# Patient Record
Sex: Female | Born: 1950 | Race: Black or African American | Hispanic: No | Smoking: Former smoker
Health system: Southern US, Community
[De-identification: ages and names within clinical notes are randomized; demographics above are authoritative.]

## PROBLEM LIST (undated history)

## (undated) DIAGNOSIS — E119 Type 2 diabetes mellitus without complications: Secondary | ICD-10-CM

## (undated) DIAGNOSIS — M889 Osteitis deformans of unspecified bone: Secondary | ICD-10-CM

## (undated) DIAGNOSIS — I10 Essential (primary) hypertension: Secondary | ICD-10-CM

## (undated) DIAGNOSIS — M719 Bursopathy, unspecified: Secondary | ICD-10-CM

## (undated) HISTORY — PX: CHOLECYSTECTOMY: SHX55

## (undated) HISTORY — PX: ABDOMINAL HYSTERECTOMY: SHX81

---

## 1998-06-06 ENCOUNTER — Other Ambulatory Visit: Admission: RE | Admit: 1998-06-06 | Discharge: 1998-06-06 | Payer: Self-pay | Admitting: Family Medicine

## 1998-07-26 ENCOUNTER — Ambulatory Visit (HOSPITAL_COMMUNITY): Admission: RE | Admit: 1998-07-26 | Discharge: 1998-07-26 | Payer: Self-pay | Admitting: Family Medicine

## 1998-07-27 ENCOUNTER — Ambulatory Visit (HOSPITAL_COMMUNITY): Admission: RE | Admit: 1998-07-27 | Discharge: 1998-07-27 | Payer: Self-pay | Admitting: Unknown Physician Specialty

## 1998-07-27 ENCOUNTER — Encounter: Payer: Self-pay | Admitting: Family Medicine

## 1998-08-18 ENCOUNTER — Encounter: Payer: Self-pay | Admitting: Family Medicine

## 1998-08-18 ENCOUNTER — Ambulatory Visit (HOSPITAL_COMMUNITY): Admission: RE | Admit: 1998-08-18 | Discharge: 1998-08-18 | Payer: Self-pay | Admitting: Family Medicine

## 1999-01-19 ENCOUNTER — Ambulatory Visit (HOSPITAL_COMMUNITY): Admission: RE | Admit: 1999-01-19 | Discharge: 1999-01-19 | Payer: Self-pay | Admitting: Family Medicine

## 2001-08-01 ENCOUNTER — Encounter: Payer: Self-pay | Admitting: Emergency Medicine

## 2001-08-01 ENCOUNTER — Emergency Department (HOSPITAL_COMMUNITY): Admission: EM | Admit: 2001-08-01 | Discharge: 2001-08-01 | Payer: Self-pay | Admitting: Emergency Medicine

## 2002-04-20 ENCOUNTER — Emergency Department (HOSPITAL_COMMUNITY): Admission: EM | Admit: 2002-04-20 | Discharge: 2002-04-20 | Payer: Self-pay

## 2002-05-15 ENCOUNTER — Emergency Department (HOSPITAL_COMMUNITY): Admission: EM | Admit: 2002-05-15 | Discharge: 2002-05-16 | Payer: Self-pay | Admitting: *Deleted

## 2002-05-15 ENCOUNTER — Encounter: Payer: Self-pay | Admitting: Family Medicine

## 2002-09-13 ENCOUNTER — Emergency Department (HOSPITAL_COMMUNITY): Admission: EM | Admit: 2002-09-13 | Discharge: 2002-09-13 | Payer: Self-pay

## 2003-01-25 ENCOUNTER — Encounter (INDEPENDENT_AMBULATORY_CARE_PROVIDER_SITE_OTHER): Payer: Self-pay

## 2003-01-25 ENCOUNTER — Observation Stay (HOSPITAL_COMMUNITY): Admission: RE | Admit: 2003-01-25 | Discharge: 2003-01-26 | Payer: Self-pay | Admitting: General Surgery

## 2003-02-22 ENCOUNTER — Encounter: Admission: RE | Admit: 2003-02-22 | Discharge: 2003-05-23 | Payer: Self-pay | Admitting: Internal Medicine

## 2004-09-25 ENCOUNTER — Ambulatory Visit (HOSPITAL_COMMUNITY): Admission: RE | Admit: 2004-09-25 | Discharge: 2004-09-25 | Payer: Self-pay | Admitting: Gastroenterology

## 2004-09-25 ENCOUNTER — Encounter (INDEPENDENT_AMBULATORY_CARE_PROVIDER_SITE_OTHER): Payer: Self-pay | Admitting: *Deleted

## 2005-01-23 ENCOUNTER — Emergency Department (HOSPITAL_COMMUNITY): Admission: EM | Admit: 2005-01-23 | Discharge: 2005-01-23 | Payer: Self-pay | Admitting: Emergency Medicine

## 2005-03-04 ENCOUNTER — Emergency Department (HOSPITAL_COMMUNITY): Admission: EM | Admit: 2005-03-04 | Discharge: 2005-03-04 | Payer: Self-pay | Admitting: Emergency Medicine

## 2007-12-23 ENCOUNTER — Emergency Department (HOSPITAL_COMMUNITY): Admission: EM | Admit: 2007-12-23 | Discharge: 2007-12-23 | Payer: Self-pay | Admitting: Emergency Medicine

## 2008-06-10 ENCOUNTER — Emergency Department (HOSPITAL_COMMUNITY): Admission: EM | Admit: 2008-06-10 | Discharge: 2008-06-10 | Payer: Self-pay | Admitting: Emergency Medicine

## 2010-05-03 ENCOUNTER — Encounter (HOSPITAL_COMMUNITY): Admission: RE | Admit: 2010-05-03 | Discharge: 2010-06-20 | Payer: Self-pay | Admitting: Orthopedic Surgery

## 2010-07-06 ENCOUNTER — Ambulatory Visit (HOSPITAL_COMMUNITY): Admission: RE | Admit: 2010-07-06 | Discharge: 2010-07-06 | Payer: Self-pay | Admitting: Obstetrics and Gynecology

## 2011-04-06 NOTE — Op Note (Signed)
NAME:  Leah Thomas, Leah Thomas                      ACCOUNT NO.:  [id number]   MEDICAL RECORD NO.:  [id number]                   PATIENT TYPE:  AMB   LOCATION:  DAY                                  FACILITY:  [facility]   PHYSICIAN:  Anselm Pancoast. Zachery Dakins, M.D.          DATE OF BIRTH:  1950/12/10   DATE OF PROCEDURE:  01/25/2003  DATE OF DISCHARGE:                                 OPERATIVE REPORT   PREOPERATIVE DIAGNOSES:  Chronic cholelithiasis with probable passage of  common duct stone.   POSTOPERATIVE DIAGNOSES:  Chronic cholelithiasis with probable passage of  common duct stone.   OPERATION:  Laparoscopic cholecystectomy with cholangiogram.   ANESTHESIA:  General.   HISTORY:  Leah Thomas is a 60 year old female who I had seen previously  for anorectal problems and she came in the office on Friday after being seen  in the [facility] for epigastric pain and they did an ultrasound plus liver  tests which showed multiple gallstones. She has been seen previously in the  emergency room here and was thought to be having chest wall pain,  diaphragmatic spasm or something. In retrospect, it was probably biliary in  origin also. The patient does get epigastric pain radiating to the smaller  back in which she was seen at [facility]. Her liver tests were mildly  elevated as well as a lipase that was elevated. Her pain had subsided about  24 hours prior to the onset of pain and her previous surgery she has had a  hysterectomy in 1992 and otherwise is in good health. I recommended that we  proceed with a laparoscopic cholecystectomy with cholangiogram and she is  here for the planned procedure. Preoperatively she has 3 gm of Unasyn that  was administered, she has PAS stockings and induction of general anesthesia.  The abdomen was prepped with Betadine surgical solution, draped in a sterile  manner. A small incision was made below the umbilicus, she is a little bit  over weight, with fascia  carefully opened and a hemostat placed carefully  through the peritoneum. Traction sutures were placed, Hasson cannula  introduced and then carbon dioxide used. The patient's gallbladder was not  acutely inflamed but there was adhesions around it and these were carefully  taken then and then the proximal portion of the gallbladder was identified  where the junction of the cystic artery and cystic duct was appearing. I  placed a clip flush on the gallbladder cystic duct junction. The anterior  branch of the cystic artery was then dissected free and this was doubly  clipped proximally, singly distally and divided and then we made a small  opening through the proximal cystic duct. One stone was at the area where we  opened and this was milked back. We tried to see if we could lift any other  stones and I could not put a Cooke catheter in it and then basically his dye  is not  flowing. I opened the cystic duct a little more proximally and then  crushed and got out a second stone out of the second duct and then a  catheter was introduced and held in place with a clip. Cholangiogram was  obtained and there was flow into the duodenum. I expect that she has  definitely passed a common duct stone as there was a little dilatation on a  couple of views. The catheter was removed. I clipped the cystic duct about 2  cm more proximal then where we had opened so that this area where it had the  second little stone in was excised. Next, the posterior branch of the cystic  artery was doubly clipped and then divided distally and then the gallbladder  was freed from its bed using hook electrocautery. The camera was switched to  the upper 10 mm port and the gallbladder brought out through the umbilical  fascia and there were multiple small stones. Next, the irrigating fluid had  been aspirated, I tied the pursestring suture of #0 Vicryl that I had placed  and put a second figure-of-eight at the umbilicus and then  anesthetized  this. The other trocar ports had been anesthetized when the trocars had been  inserted carefully. The two lateral 5 mm ports were withdrawn and then the  carbon  dioxide released and the upper 10 mm trocar withdrawn under direct vision.  Sponge and needle counts were correct. The subcutaneous wounds were closed  with 4-0 Vicryl and benzon Steri-Strips on the skin. The patient tolerated  the procedure well and was sent to the recovery room extubated in a  satisfactory postoperative condition.                                               Anselm Pancoast. Zachery Dakins, M.D.    WJW/MEDQ  D:  01/25/2003  T:  01/25/2003  Job:  [id number]

## 2011-04-06 NOTE — Op Note (Signed)
NAMEMONIQUA, Leah Thomas            ACCOUNT NO.:  [id number]   MEDICAL RECORD NO.:  [id number]          PATIENT TYPE:  AMB   LOCATION:  ENDO                         FACILITY:  [facility]   PHYSICIAN:  Anselmo Rod, M.D.  DATE OF BIRTH:  1951-06-16   DATE OF PROCEDURE:  09/25/2004  DATE OF DISCHARGE:  09/25/2004                                 OPERATIVE REPORT   PROCEDURE PERFORMED:  Esophagogastroduodenoscopy with antral biopsies.   ENDOSCOPIST:  Anselmo Rod, M.D.   INSTRUMENT USED:  Olympus video panendoscope.   INDICATION FOR PROCEDURE:  A 60 year old African-American female with a  history of abnormal weight loss and abdominal pain in the periumbilical and  epigastric area.  Rule out peptic ulcer disease, esophagitis, gastritis,  etc.   PREPROCEDURE PREPARATION:  Informed consent was procured from the patient.  The patient was fasted for eight hours prior to the procedure.   PREPROCEDURE PHYSICAL:  VITAL SIGNS:  The patient had stable vital signs.  NECK:  Supple.  CHEST:  Clear to auscultation.  S1, S2 regular.  ABDOMEN:  Soft with normal bowel sounds.   DESCRIPTION OF PROCEDURE:  The patient was placed in the left lateral  decubitus position and sedated with 100 mg of Demerol and 7 mg of Versed in  slow incremental doses.  Once the patient was adequately sedate and  maintained on low-flow oxygen and continuous cardiac monitoring, the Olympus  video panendoscope was advanced through the mouthpiece, over the tongue, and  into the esophagus under direct vision.  The entire esophagus appeared  normal with no evidence of ring, stricture, masses, esophagitis, or  Barrett's mucosa.  The scope was then advanced in the stomach.  A small  hiatal hernia was seen on high retroflexion.  Diffuse gastritis was noted  throughout the gastric mucosa, especially in the antral area.  A prominent  fold was biopsied from the antrum.  A prominent fold was biopsied from the  duodenal bulb.  The  small bowel distal to the bulb appeared normal.  There  was no outlet obstruction.  No ulcers or erosions were noted.  The patient  tolerated the procedure well without complications.   IMPRESSION:  1.  Normal-appearing esophagus.  2.  Small hiatal hernia.  3.  Diffuse gastritis, especially in the antral area.  4.  Prominent fold in the antrum, biopsied for pathology.  5.  Prominent fold in the duodenal bulb, biopsied for pathology, etiology      unclear.  6.  Normal small bowel distal to the bulb up to 60 cm.   RECOMMENDATIONS:  1.  Await pathology results.  2.  Avoid nonsteroidals, including aspirin, for now.  3.  Proceed with a colonoscopy at this time.  4.  Outpatient follow-up in the next two weeks for further recommendations.       JNM/MEDQ  D:  09/27/2004  T:  09/27/2004  Job:  [id number]   cc:   Gabriel Earing, M.D.  [address]  [place]  [place] [address]  Fax: [phone]

## 2011-04-06 NOTE — Op Note (Signed)
Leah Thomas, Leah Thomas            ACCOUNT NO.:  [id number]   MEDICAL RECORD NO.:  [id number]          PATIENT TYPE:  AMB   LOCATION:  ENDO                         FACILITY:  [facility]   PHYSICIAN:  Anselmo Rod, M.D.  DATE OF BIRTH:  27-Jan-1951   DATE OF PROCEDURE:  09/25/2004  DATE OF DISCHARGE:  09/25/2004                                 OPERATIVE REPORT   PROCEDURE PERFORMED:  Screening colonoscopy.   ENDOSCOPIST:  Anselmo Rod, M.D.   INSTRUMENT USED:  Olympus video colonoscope.   INDICATION FOR PROCEDURE:  A 60 year old African-American female with a  history of abnormal weight loss, undergoing screening colonoscopy.  Rule out  colonic polyps, masses, etc.  The patient also has diffuse abdominal pain,  especially in the periumbilical and epigastric area.   PREPROCEDURE PREPARATION:  Informed consent was procured from the patient.  The patient was fasted for eight hours prior to the procedure and prepped  with a bottle of magnesium citrate and a gallon of GoLYTELY the night prior  to the procedure.   PREPROCEDURE PHYSICAL:  VITAL SIGNS:  The patient had stable vital signs.  NECK:  Supple.  CHEST:  Clear to auscultation.  S1, S2 regular.  ABDOMEN:  Soft with normal bowel sounds.   DESCRIPTION OF PROCEDURE:  The patient was placed in the left lateral  decubitus position and sedated with an additional 3 mg of Versed.  Once the  patient was adequately sedate and maintained on low-flow oxygen and  continuous cardiac monitoring, the Olympus video colonoscope was advanced  from the rectum to the cecum.  There was a large amount of residual stool in  the colon, and multiple washes were done.  Small lesions could have been  missed.  A few sigmoid diverticula were noted.  No other abnormalities were  identified.  No masses or polyps were seen.  The patient tolerated the  procedure well without complications.  Small internal hemorrhoids were seen  on retroflexion.    IMPRESSION:  1.  Small, nonbleeding internal hemorrhoids seen on retroflexion.  2.  Few early sigmoid diverticula.  3.  Large amount of residual stool in the colon, small lesions could have      been missed.   RECOMMENDATIONS:  1.  Continue a high-fiber diet with liberal fluid intake.  Brochures on      diverticulosis have been given to the patient for her education.  2.  Outpatient follow-up in the next two weeks for further recommendations.       JNM/MEDQ  D:  09/27/2004  T:  09/27/2004  Job:  [id number]

## 2011-08-17 LAB — URINALYSIS, ROUTINE W REFLEX MICROSCOPIC
Bilirubin Urine: NEGATIVE
Leukocytes, UA: NEGATIVE
Specific Gravity, Urine: 1.022
pH: 8

## 2011-08-17 LAB — URINE MICROSCOPIC-ADD ON

## 2012-03-21 ENCOUNTER — Other Ambulatory Visit: Payer: Self-pay | Admitting: Internal Medicine

## 2012-04-11 ENCOUNTER — Other Ambulatory Visit: Payer: Self-pay

## 2012-04-18 ENCOUNTER — Other Ambulatory Visit: Payer: Self-pay

## 2012-09-01 ENCOUNTER — Encounter (HOSPITAL_COMMUNITY): Payer: Self-pay | Admitting: *Deleted

## 2012-09-01 ENCOUNTER — Emergency Department (HOSPITAL_COMMUNITY): Payer: 59

## 2012-09-01 ENCOUNTER — Emergency Department (HOSPITAL_COMMUNITY)
Admission: EM | Admit: 2012-09-01 | Discharge: 2012-09-01 | Disposition: A | Discharge status: Home / Self Care | Payer: 59 | Attending: Emergency Medicine | Admitting: Emergency Medicine

## 2012-09-01 DIAGNOSIS — M545 Low back pain, unspecified: Secondary | ICD-10-CM | POA: Insufficient documentation

## 2012-09-01 DIAGNOSIS — M889 Osteitis deformans of unspecified bone: Secondary | ICD-10-CM | POA: Insufficient documentation

## 2012-09-01 DIAGNOSIS — M5380 Other specified dorsopathies, site unspecified: Secondary | ICD-10-CM | POA: Insufficient documentation

## 2012-09-01 DIAGNOSIS — M62830 Muscle spasm of back: Secondary | ICD-10-CM

## 2012-09-01 DIAGNOSIS — I10 Essential (primary) hypertension: Secondary | ICD-10-CM | POA: Insufficient documentation

## 2012-09-01 DIAGNOSIS — F17200 Nicotine dependence, unspecified, uncomplicated: Secondary | ICD-10-CM | POA: Insufficient documentation

## 2012-09-01 DIAGNOSIS — M719 Bursopathy, unspecified: Secondary | ICD-10-CM | POA: Insufficient documentation

## 2012-09-01 DIAGNOSIS — E119 Type 2 diabetes mellitus without complications: Secondary | ICD-10-CM | POA: Insufficient documentation

## 2012-09-01 HISTORY — DX: Bursopathy, unspecified: M71.9

## 2012-09-01 HISTORY — DX: Type 2 diabetes mellitus without complications: E11.9

## 2012-09-01 HISTORY — DX: Osteitis deformans of unspecified bone: M88.9

## 2012-09-01 HISTORY — DX: Essential (primary) hypertension: I10

## 2012-09-01 LAB — GLUCOSE, CAPILLARY: Glucose-Capillary: 119 mg/dL — ABNORMAL HIGH (ref 70–99)

## 2012-09-01 MED ORDER — OXYCODONE-ACETAMINOPHEN 5-325 MG PO TABS: 1.0000 | ORAL_TABLET | Freq: Four times a day (QID) | ORAL | Status: DC | PRN

## 2012-09-01 MED ORDER — SODIUM CHLORIDE 0.9 % IV SOLN
INTRAVENOUS | Status: DC
  Administered 2012-09-01: 10 mL/h via INTRAVENOUS

## 2012-09-01 MED ORDER — HYDROMORPHONE HCL PF 1 MG/ML IJ SOLN
1.0000 mg | Freq: Once | INTRAMUSCULAR | Status: AC
  Administered 2012-09-01: 1 mg via INTRAVENOUS
  Filled 2012-09-01: qty 1

## 2012-09-01 MED ORDER — DIAZEPAM 5 MG PO TABS: 5.0000 mg | ORAL_TABLET | Freq: Three times a day (TID) | ORAL | Status: DC | PRN

## 2012-09-01 MED ORDER — KETOROLAC TROMETHAMINE 30 MG/ML IJ SOLN
30.0000 mg | Freq: Once | INTRAMUSCULAR | Status: AC
  Administered 2012-09-01: 30 mg via INTRAVENOUS
  Filled 2012-09-01: qty 1

## 2012-09-01 NOTE — ED Notes (Signed)
Onset of Left middle back pain with radiation down left back began one week ago.  Denies injury to back.  HX Pagets DX and Bursitis.  Rates pain 10/10 unrelieved by HYdrocodone, last taken 10.13.13 at 1100hrs.  NOt taking med because of drowsiness. Denies nausea or vomiting, diarrhea.  Denies urinary or fecal incontinence.  Does c/o numbness to left side times three days.

## 2012-09-01 NOTE — ED Notes (Signed)
Patient transported to X-ray 

## 2012-09-01 NOTE — ED Notes (Signed)
CBG checked 119.

## 2012-09-01 NOTE — ED Provider Notes (Signed)
History     CSN: [id number]  Arrival date & time 09/01/12  2536   First MD Initiated Contact with Patient 09/01/12 [phone]      Chief Complaint  Patient presents with  . 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 Denies radiation into her leg. No numbness or weakness No problems with bowel or bladder. She reports this pain is different from her previous Paget's pain.   Past Medical History  Diagnosis Date  . Paget disease of bone   . Bursitis   . Diabetes mellitus without complication   . Hypertension     Past Surgical History  Procedure Date  . Abdominal hysterectomy   . Cholecystectomy     No family history on file.  History  Substance Use Topics  . Smoking status: Current Every Day Smoker -- 1.0 packs/day for 10 years    Types: Cigarettes  . Smokeless tobacco: Not on file  . Alcohol Use: No    OB History    Grav Para Term Preterm Abortions TAB SAB Ect Mult Living                  Review of Systems All other systems reviewed and are negative except as noted in HPI.   Allergies  Benadryl and Dilantin  Home Medications   Current Outpatient Rx  Name Route Sig Dispense Refill  . ATORVASTATIN CALCIUM 20 MG PO TABS Oral Take 20 mg by mouth daily.    Marland Kitchen CITRACAL PO Oral Take 2 tablets by mouth daily.    Marland Kitchen HYDROCHLOROTHIAZIDE 25 MG PO TABS Oral Take 25 mg by mouth daily.    Marland Kitchen HYDROCODONE-ACETAMINOPHEN 5-325 MG PO TABS Oral Take 1 tablet by mouth every 6 (six) hours as needed. For pain    . LISINOPRIL 20 MG PO TABS Oral Take 40 mg by mouth 2 (two) times daily.    Marland Kitchen METFORMIN HCL ER (MOD) 500 MG PO TB24 Oral Take 1,000 mg by mouth 2  (two) times daily with a meal.    . NAPROXEN SODIUM 220 MG PO TABS Oral Take 220 mg by mouth daily as needed. For pain    . SAXAGLIPTIN HCL 5 MG PO TABS Oral Take 5 mg by mouth daily.    . TRAMADOL HCL 50 MG PO TABS Oral Take 50 mg by mouth every 6 (six) hours as needed. For pain      BP 175/106  Pulse 104  Temp 97.2 F (36.2 C) (Oral)  Resp 20  SpO2 97%  Physical Exam  Nursing note and vitals reviewed. Constitutional: She is oriented to person, place, and time. She appears well-developed and well-nourished.  HENT:  Head: Normocephalic and atraumatic.  Eyes: EOM are normal. Pupils are equal, round, and reactive to light.  Neck: Normal range of motion. Neck supple.  Cardiovascular: Normal rate, normal heart sounds and intact distal pulses.   Pulmonary/Chest: Effort normal and breath sounds normal.  Abdominal: Bowel sounds are normal. She exhibits no distension. There is no tenderness.  Musculoskeletal: Normal range of motion. She exhibits tenderness. She exhibits no edema.       Lumbar back: She exhibits tenderness. She exhibits no bony tenderness.  Back:  Neurological: She is alert and oriented to person, place, and time. She has normal strength. She displays normal reflexes. No cranial nerve deficit or sensory deficit.  Skin: Skin is warm and dry. No rash noted.  Psychiatric: She has a normal mood and affect.    ED Course  Procedures (including critical care time)  Labs Reviewed  GLUCOSE, CAPILLARY - Abnormal; Notable for the following:    Glucose-Capillary 119 (*)     All other components within normal limits   Dg Lumbar Spine Complete  09/01/2012  *RADIOLOGY REPORT*  Clinical Data: Low back pain, history of Paget's disease  LUMBAR SPINE - COMPLETE 4+ VIEW  Comparison: 05/30/2012  Findings: Five views of the lumbar spine submitted.  No acute fracture or subluxation.  Stable mild disc space flattening at L1- L2 and L3-L4 level.  Multilevel mild facet degenerative  changes . Extensive degenerative changes left hip joint.  IMPRESSION: No acute fracture or subluxation.  Stable degenerative changes as described above.   Original Report Authenticated By: Natasha Mead, M.D.    Dg Pelvis 1-2 Views  09/01/2012  *RADIOLOGY REPORT*  Clinical Data: Back pain, history of Paget's disease  PELVIS - 1-2 VIEW  Comparison: 05/30/2012  Findings: Single frontal view of the pelvis submitted.  No acute fracture or subluxation. Pelvic phleboliths are noted. Degenerative changes are noted left hip joint with narrowing superior joint space.  Again noted irregular sclerosis and trabecular thickening left iliac bone and ischial bones.  IMPRESSION: No acute fracture or subluxation. Degenerative changes are noted left hip joint with narrowing superior joint space.  Again noted irregular sclerosis and trabecular thickening left iliac bone and ischial bones consistent with Paget's disease.   Original Report Authenticated By: Natasha Mead, M.D.      No diagnosis found.    MDM  Pt feeling some better, xrays as above neg for acute change. Pain likely muscle spasm. Advised to avoid heavy lifting. Advised not to drive while taking pain medications or muscle relaxers.         Naelani Lafrance B. Bernette Mayers, MD 09/01/12 [phone]

## 2014-03-29 ENCOUNTER — Ambulatory Visit: Payer: 59 | Admitting: Physician Assistant

## 2014-04-09 ENCOUNTER — Ambulatory Visit: Payer: 59 | Admitting: Internal Medicine

## 2015-12-08 DIAGNOSIS — E538 Deficiency of other specified B group vitamins: Secondary | ICD-10-CM | POA: Insufficient documentation

## 2015-12-17 DIAGNOSIS — K649 Unspecified hemorrhoids: Secondary | ICD-10-CM | POA: Insufficient documentation

## 2015-12-17 DIAGNOSIS — G8929 Other chronic pain: Secondary | ICD-10-CM | POA: Insufficient documentation

## 2015-12-17 DIAGNOSIS — R1310 Dysphagia, unspecified: Secondary | ICD-10-CM | POA: Insufficient documentation

## 2016-02-20 DIAGNOSIS — E7800 Pure hypercholesterolemia, unspecified: Secondary | ICD-10-CM | POA: Insufficient documentation

## 2016-05-31 DIAGNOSIS — Z5181 Encounter for therapeutic drug level monitoring: Secondary | ICD-10-CM | POA: Insufficient documentation

## 2016-07-04 ENCOUNTER — Encounter (HOSPITAL_COMMUNITY): Payer: Self-pay | Admitting: Emergency Medicine

## 2016-07-04 ENCOUNTER — Ambulatory Visit (HOSPITAL_COMMUNITY)
Admission: EM | Admit: 2016-07-04 | Discharge: 2016-07-04 | Disposition: A | Discharge status: Home / Self Care | Payer: Medicare HMO | Attending: Family Medicine | Admitting: Family Medicine

## 2016-07-04 DIAGNOSIS — Z7982 Long term (current) use of aspirin: Secondary | ICD-10-CM | POA: Diagnosis not present

## 2016-07-04 DIAGNOSIS — I10 Essential (primary) hypertension: Secondary | ICD-10-CM | POA: Diagnosis not present

## 2016-07-04 DIAGNOSIS — B9689 Other specified bacterial agents as the cause of diseases classified elsewhere: Secondary | ICD-10-CM | POA: Insufficient documentation

## 2016-07-04 DIAGNOSIS — E119 Type 2 diabetes mellitus without complications: Secondary | ICD-10-CM | POA: Diagnosis not present

## 2016-07-04 DIAGNOSIS — B3731 Acute candidiasis of vulva and vagina: Secondary | ICD-10-CM

## 2016-07-04 DIAGNOSIS — Z79899 Other long term (current) drug therapy: Secondary | ICD-10-CM | POA: Diagnosis not present

## 2016-07-04 DIAGNOSIS — Z888 Allergy status to other drugs, medicaments and biological substances status: Secondary | ICD-10-CM | POA: Diagnosis not present

## 2016-07-04 DIAGNOSIS — B373 Candidiasis of vulva and vagina: Secondary | ICD-10-CM | POA: Diagnosis not present

## 2016-07-04 DIAGNOSIS — F17210 Nicotine dependence, cigarettes, uncomplicated: Secondary | ICD-10-CM | POA: Insufficient documentation

## 2016-07-04 DIAGNOSIS — A499 Bacterial infection, unspecified: Secondary | ICD-10-CM

## 2016-07-04 DIAGNOSIS — Z7984 Long term (current) use of oral hypoglycemic drugs: Secondary | ICD-10-CM | POA: Diagnosis not present

## 2016-07-04 DIAGNOSIS — N760 Acute vaginitis: Secondary | ICD-10-CM | POA: Insufficient documentation

## 2016-07-04 MED ORDER — FLUCONAZOLE 150 MG PO TABS: 150.0000 mg | ORAL_TABLET | Freq: Every day | ORAL | 0 refills | Status: DC

## 2016-07-04 MED ORDER — METRONIDAZOLE 500 MG PO TABS: 500.0000 mg | ORAL_TABLET | Freq: Two times a day (BID) | ORAL | 0 refills | Status: AC

## 2016-07-04 NOTE — ED Triage Notes (Signed)
Patient reports onset 8/4.  Patient reports using monistat x 7 days.  Patient reports last dosing was Friday.  Minimal discharge, minimal itching.  Primary complaint is burning.  Patient denies urinary issues

## 2016-07-04 NOTE — ED Provider Notes (Signed)
CSN: [id number]     Arrival date & time 07/04/16  [id number] History   First MD Initiated Contact with Patient 07/04/16 1034     Chief Complaint  Patient presents with  . Vaginitis   (Consider location/radiation/quality/duration/timing/severity/associated sxs/prior Treatment)  Leah Thomas is a 65 y.o female with history of T2DM and hypertension, presents today for vaginal burning sensation x 12 days since 08/04. She believes that she has a yeast infection, self-treated with monistat x 7 days and felt that her symptom improved but not completely done. She continues to have vaginal burning sensation. She also endorses little amount of white thin discharge. She denies itchiness. She is sexually active. She denies painful sex.        Past Medical History:  Diagnosis Date  . Bursitis   . Diabetes mellitus without complication (HCC)   . Hypertension   . Paget disease of bone    Past Surgical History:  Procedure Laterality Date  . ABDOMINAL HYSTERECTOMY    . CHOLECYSTECTOMY     No family history on file. Social History  Substance Use Topics  . Smoking status: Current Every Day Smoker    Packs/day: 1.00    Years: 10.00    Types: Cigarettes  . Smokeless tobacco: Not on file  . Alcohol use No   OB History    No data available     Review of Systems  Constitutional: Negative for chills, fatigue and fever.  Respiratory: Negative for cough and shortness of breath.   Cardiovascular: Negative for chest pain, palpitations and leg swelling.  Gastrointestinal: Negative for abdominal pain, diarrhea, nausea and vomiting.  Genitourinary: Positive for vaginal discharge. Negative for dyspareunia, dysuria, frequency, urgency and vaginal pain.       Positive for vaginal burning    Allergies  Benadryl [diphenhydramine hcl] and Dilantin [phenytoin]  Home Medications   Prior to Admission medications   Medication Sig Start Date End Date Taking? Authorizing Provider  amLODipine (NORVASC) 5 MG  tablet Take 5 mg by mouth daily.   Yes Historical Provider, MD  aspirin 81 MG tablet Take 81 mg by mouth daily.   Yes Historical Provider, MD  atorvastatin (LIPITOR) 20 MG tablet Take 20 mg by mouth daily.   Yes Historical Provider, MD  Calcium Citrate (CITRACAL PO) Take 2 tablets by mouth daily.   Yes Historical Provider, MD  hydrochlorothiazide (HYDRODIURIL) 25 MG tablet Take 25 mg by mouth daily.   Yes Historical Provider, MD  losartan (COZAAR) 100 MG tablet Take 100 mg by mouth daily.   Yes Historical Provider, MD  metFORMIN (GLUMETZA) 500 MG (MOD) 24 hr tablet Take 1,000 mg by mouth 2 (two) times daily with a meal.   Yes Historical Provider, MD  Cyanocobalamin (B-12) 500 MCG TABS Take by mouth.    Historical Provider, MD  diazepam (VALIUM) 5 MG tablet Take 1 tablet (5 mg total) by mouth every 8 (eight) hours as needed (muscle spasm). 09/01/12   Susy Frizzleharles Sheldon, MD  fluconazole (DIFLUCAN) 150 MG tablet Take 1 tablet (150 mg total) by mouth daily. Take 1 tab today; repeat in 72 hours 07/04/16   Lucia EstelleFeng Royal Beirne, NP  HYDROcodone-acetaminophen (NORCO/VICODIN) 5-325 MG per tablet Take 1 tablet by mouth every 6 (six) hours as needed. For pain    Historical Provider, MD  lisinopril (PRINIVIL,ZESTRIL) 20 MG tablet Take 40 mg by mouth 2 (two) times daily.    Historical Provider, MD  metroNIDAZOLE (FLAGYL) 500 MG tablet Take 1 tablet (500 mg total) by  mouth 2 (two) times daily. 07/04/16 07/11/16  Lucia EstelleFeng Genice Kimberlin, NP  naproxen sodium (ANAPROX) 220 MG tablet Take 220 mg by mouth daily as needed. For pain    Historical Provider, MD  oxyCODONE-acetaminophen (PERCOCET/ROXICET) 5-325 MG per tablet Take 1-2 tablets by mouth every 6 (six) hours as needed for pain. 09/01/12   Susy Frizzleharles Sheldon, MD  saxagliptin HCl (ONGLYZA) 5 MG TABS tablet Take 5 mg by mouth daily.    Historical Provider, MD  traMADol (ULTRAM) 50 MG tablet Take 50 mg by mouth every 6 (six) hours as needed. For pain    Historical Provider, MD   Meds Ordered and  Administered this Visit  Medications - No data to display  BP 144/90 (BP Location: Left Arm)   Pulse 102   Temp 98.1 F (36.7 C) (Oral)   Resp 18   SpO2 96%  No data found.   Physical Exam  Constitutional: She is oriented to person, place, and time. She appears well-developed and well-nourished.  Cardiovascular: Normal rate and regular rhythm.   HR 90   Pulmonary/Chest: Effort normal and breath sounds normal. She has no wheezes.  Abdominal: Soft. Bowel sounds are normal.  Genitourinary:  Genitourinary Comments: External vagina without lesion, but thin white discharge noted.  Had total hysterectomy, cervix absent.  Thick white discharge noted inside vaginal canal. No lesion in vaginal canal  Neurological: She is alert and oriented to person, place, and time.  Skin: Skin is warm and dry.  Nursing note and vitals reviewed.   Urgent Care Course   Clinical Course    Procedures (including critical care time)  Labs Review Labs Reviewed  CYTOLOGY, (ORAL, ANAL, URETHRAL) ANCILLARY ONLY    Imaging Review No results found.   Visual Acuity Review  Right Eye Distance:   Left Eye Distance:   Bilateral Distance:    Right Eye Near:   Left Eye Near:    Bilateral Near:         MDM   1. Candida vaginitis   2. Bacterial vaginosis    Suspect that she may have either Bacterial vaginosis or candida vaginitis. Will treat presumptively for yeast and BV with flagyl 500mg  BID x 7 days and diflucan 150mg  x 1; repeat in 3 days. Will screen for STD as well since she is sexually active.  Cytology pending.     Lucia EstelleFeng Brynnleigh Mcelwee, NP 07/04/16 1228

## 2016-07-04 NOTE — Discharge Instructions (Signed)
Go ahead and take 1 tablet of diflucan for yeast infection, repeat in 3 days. Take flagyl as directed for potential bacterial vaginosis.

## 2016-07-05 LAB — CYTOLOGY, (ORAL, ANAL, URETHRAL) ANCILLARY ONLY
CHLAMYDIA, DNA PROBE: NEGATIVE
Neisseria Gonorrhea: NEGATIVE

## 2016-07-05 LAB — CERVICOVAGINAL ANCILLARY ONLY: WET PREP (BD AFFIRM): NEGATIVE

## 2016-08-01 ENCOUNTER — Telehealth (HOSPITAL_COMMUNITY): Payer: Self-pay | Admitting: Emergency Medicine

## 2016-08-01 NOTE — Telephone Encounter (Signed)
Called pt and notified of recent lab results from visit 8/16 Pt ID'd properly... Reports feeling better and sx have subsided Adv pt if sx are not getting better to return or to f/u w/PCP Apologized to pt for late notification but told her we usually don't call pt w/normal labs. Pt verb understanding.

## 2016-08-06 DIAGNOSIS — H401131 Primary open-angle glaucoma, bilateral, mild stage: Secondary | ICD-10-CM | POA: Insufficient documentation

## 2016-08-06 DIAGNOSIS — H11153 Pinguecula, bilateral: Secondary | ICD-10-CM | POA: Insufficient documentation

## 2016-08-06 DIAGNOSIS — H2513 Age-related nuclear cataract, bilateral: Secondary | ICD-10-CM | POA: Insufficient documentation

## 2018-12-26 ENCOUNTER — Emergency Department (HOSPITAL_COMMUNITY)
Admission: EM | Admit: 2018-12-26 | Discharge: 2018-12-26 | Disposition: A | Discharge status: Home / Self Care | Payer: Medicare HMO | Attending: Emergency Medicine | Admitting: Emergency Medicine

## 2018-12-26 ENCOUNTER — Other Ambulatory Visit: Payer: Self-pay

## 2018-12-26 ENCOUNTER — Encounter (HOSPITAL_COMMUNITY): Payer: Self-pay | Admitting: Emergency Medicine

## 2018-12-26 ENCOUNTER — Emergency Department (HOSPITAL_COMMUNITY): Payer: Medicare HMO

## 2018-12-26 DIAGNOSIS — J101 Influenza due to other identified influenza virus with other respiratory manifestations: Secondary | ICD-10-CM | POA: Diagnosis not present

## 2018-12-26 DIAGNOSIS — Z7982 Long term (current) use of aspirin: Secondary | ICD-10-CM | POA: Insufficient documentation

## 2018-12-26 DIAGNOSIS — F17210 Nicotine dependence, cigarettes, uncomplicated: Secondary | ICD-10-CM | POA: Diagnosis not present

## 2018-12-26 DIAGNOSIS — Z79899 Other long term (current) drug therapy: Secondary | ICD-10-CM | POA: Diagnosis not present

## 2018-12-26 DIAGNOSIS — I10 Essential (primary) hypertension: Secondary | ICD-10-CM | POA: Insufficient documentation

## 2018-12-26 DIAGNOSIS — R69 Illness, unspecified: Secondary | ICD-10-CM

## 2018-12-26 DIAGNOSIS — R05 Cough: Secondary | ICD-10-CM | POA: Diagnosis present

## 2018-12-26 DIAGNOSIS — J111 Influenza due to unidentified influenza virus with other respiratory manifestations: Secondary | ICD-10-CM

## 2018-12-26 DIAGNOSIS — E119 Type 2 diabetes mellitus without complications: Secondary | ICD-10-CM | POA: Insufficient documentation

## 2018-12-26 LAB — INFLUENZA PANEL BY PCR (TYPE A & B)
Influenza A By PCR: POSITIVE — AB
Influenza B By PCR: NEGATIVE

## 2018-12-26 MED ORDER — ACETAMINOPHEN 325 MG PO TABS: 650.0000 mg | ORAL_TABLET | Freq: Four times a day (QID) | ORAL | 0 refills | Status: AC | PRN

## 2018-12-26 MED ORDER — ACETAMINOPHEN 325 MG PO TABS
650.0000 mg | ORAL_TABLET | Freq: Once | ORAL | Status: AC
  Administered 2018-12-26: 650 mg via ORAL
  Filled 2018-12-26: qty 2

## 2018-12-26 MED ORDER — OSELTAMIVIR PHOSPHATE 75 MG PO CAPS: 75.0000 mg | ORAL_CAPSULE | Freq: Two times a day (BID) | ORAL | 0 refills | Status: DC

## 2018-12-26 MED ORDER — FLUTICASONE PROPIONATE 50 MCG/ACT NA SUSP: 1.0000 | Freq: Every day | NASAL | 0 refills | Status: DC

## 2018-12-26 MED ORDER — BENZONATATE 100 MG PO CAPS: 200.0000 mg | ORAL_CAPSULE | Freq: Three times a day (TID) | ORAL | 0 refills | Status: DC

## 2018-12-26 MED ORDER — BENZONATATE 100 MG PO CAPS
200.0000 mg | ORAL_CAPSULE | Freq: Once | ORAL | Status: AC
  Administered 2018-12-26: 200 mg via ORAL
  Filled 2018-12-26: qty 2

## 2018-12-26 NOTE — ED Triage Notes (Signed)
Very congested cough, fever and bodyaches since yesterday,

## 2018-12-26 NOTE — Discharge Instructions (Addendum)
Your flu test was positive today. Take Tamiflu as directed. Please be careful around chronically ill family members as you have an illness that could be spread. Take the other medications as needed to help with symptoms. Return to ED for worsening symptoms, coughing up blood, leg swelling, shortness of breath or increased chest pain.

## 2018-12-26 NOTE — ED Provider Notes (Signed)
[facility]   CSN: [id number] Arrival date & time: 12/26/18  1248     History   Chief Complaint Chief Complaint  Patient presents with  . URI    HPI Leah Thomas is a 68 y.o. female with a past medical history of hypertension, diabetes who presents to ED for cough productive with yellow mucus, subjective fever, sinus pressure, generalized body aches since yesterday.  Reports drainage down her throat.  Chest pressure with coughing but none at rest.  She took 1 dose of Mucinex yesterday with improvement in her cough although still coughing up mucus.  She did receive her influenza vaccine this year.  She is concerned to be around her chronically ill fianc (he is a [facility] patient).  She denies any recent travel, shortness of breath, hemoptysis, leg swelling, abdominal pain or vomiting.  HPI  Past Medical History:  Diagnosis Date  . Bursitis   . Diabetes mellitus without complication (HCC)   . Hypertension   . Paget disease of bone     Patient Active Problem List   Diagnosis Date Noted  . Paget disease of bone   . Bursitis     Past Surgical History:  Procedure Laterality Date  . ABDOMINAL HYSTERECTOMY    . CHOLECYSTECTOMY       OB History   No obstetric history on file.      Home Medications    Prior to Admission medications   Medication Sig Start Date End Date Taking? Authorizing Provider  acetaminophen (TYLENOL) 325 MG tablet Take 2 tablets (650 mg total) by mouth every 6 (six) hours as needed. 12/26/18   Manha Amato, PA-C  amLODipine (NORVASC) 5 MG tablet Take 5 mg by mouth daily.    [provider]  aspirin 81 MG tablet Take 81 mg by mouth daily.    [provider]  atorvastatin (LIPITOR) 20 MG tablet Take 20 mg by mouth daily.    [provider]  benzonatate (TESSALON) 100 MG capsule Take 2 capsules (200 mg total) by mouth every 8 (eight) hours. 12/26/18   Kerry-Anne Mezo, PA-C    Calcium Citrate (CITRACAL PO) Take 2 tablets by mouth daily.    [provider]  Cyanocobalamin (B-12) 500 MCG TABS Take by mouth.    [provider]  diazepam (VALIUM) 5 MG tablet Take 1 tablet (5 mg total) by mouth every 8 (eight) hours as needed (muscle spasm). 09/01/12   Susy FrizzleSheldon, Charles, MD  fluconazole (DIFLUCAN) 150 MG tablet Take 1 tablet (150 mg total) by mouth daily. Take 1 tab today; repeat in 72 hours 07/04/16   Lucia EstelleZheng, Feng, NP  fluticasone [facility](FLONASE) 50 MCG/ACT nasal spray Place 1 spray into both nostrils daily. 12/26/18   Hajira Verhagen, PA-C  hydrochlorothiazide (HYDRODIURIL) 25 MG tablet Take 25 mg by mouth daily.    [provider]  HYDROcodone-acetaminophen (NORCO/VICODIN) 5-325 MG per tablet Take 1 tablet by mouth every 6 (six) hours as needed. For pain    [provider]  lisinopril (PRINIVIL,ZESTRIL) 20 MG tablet Take 40 mg by mouth 2 (two) times daily.    [provider]  losartan (COZAAR) 100 MG tablet Take 100 mg by mouth daily.    [provider]  metFORMIN (GLUMETZA) 500 MG (MOD) 24 hr tablet Take 1,000 mg by mouth 2 (two) times daily with a meal.    [provider]  naproxen sodium (ANAPROX) 220 MG tablet Take 220 mg by mouth  daily as needed. For pain    [provider]  oseltamivir (TAMIFLU) 75 MG capsule Take 1 capsule (75 mg total) by mouth every 12 (twelve) hours. 12/26/18   Haiven Nardone, PA-C  oxyCODONE-acetaminophen (PERCOCET/ROXICET) 5-325 MG per tablet Take 1-2 tablets by mouth every 6 (six) hours as needed for pain. 09/01/12   Susy Frizzle, MD  saxagliptin HCl (ONGLYZA) 5 MG TABS tablet Take 5 mg by mouth daily.    [provider]  traMADol (ULTRAM) 50 MG tablet Take 50 mg by mouth every 6 (six) hours as needed. For pain    [provider]    Family History No family history on file.  Social History Social History   Tobacco Use  . Smoking status: Current Every Day Smoker     Packs/day: 1.00    Years: 10.00    Pack years: 10.00    Types: Cigarettes  . Smokeless tobacco: Never Used  Substance Use Topics  . Alcohol use: No  . Drug use: No     Allergies   Benadryl [diphenhydramine hcl] and Dilantin [phenytoin]   Review of Systems Review of Systems  Constitutional: Positive for chills and fever.  HENT: Positive for congestion and sinus pressure. Negative for ear discharge, ear pain and sore throat.   Respiratory: Positive for cough.      Physical Exam Updated Vital Signs BP (!) 153/112 (BP Location: Right Arm)   Pulse 99   Temp 99.9 F (37.7 C) (Oral)   Resp 18   SpO2 97%   Physical Exam Vitals signs and nursing note reviewed.  Constitutional:      General: She is not in acute distress.    Appearance: She is well-developed. She is not diaphoretic.     Comments: Cough noted on exam.  HENT:     Head: Normocephalic and atraumatic.     Right Ear: A middle ear effusion is present.     Left Ear: A middle ear effusion is present.     Nose:     Right Sinus: Frontal sinus tenderness present.     Left Sinus: Frontal sinus tenderness present.  Eyes:     General: No scleral icterus.    Conjunctiva/sclera: Conjunctivae normal.  Neck:     Musculoskeletal: Normal range of motion.  Cardiovascular:     Rate and Rhythm: Normal rate and regular rhythm.     Heart sounds: Normal heart sounds.  Pulmonary:     Effort: Pulmonary effort is normal. No respiratory distress.     Comments: Coarse breath sounds bilaterally. No signs of respiratory distress. Skin:    Findings: No rash.  Neurological:     Mental Status: She is alert.      ED Treatments / Results  Labs (all labs ordered are listed, but only abnormal results are displayed) Labs Reviewed  INFLUENZA PANEL BY PCR (TYPE A & B) - Abnormal; Notable for the following components:      Result Value   Influenza A By PCR POSITIVE (*)    All other components within normal limits     EKG None  Radiology Dg Chest 2 View  Result Date: 12/26/2018 CLINICAL DATA:  Chills and cough since yesterday. History of hypertension and diabetes. EXAM: CHEST - 2 VIEW COMPARISON:  Radiographs 02/01/2017 and 12/23/2007 FINDINGS: The heart size and mediastinal contours are stable. There is aortic atherosclerosis. The lungs are clear. There is no pleural effusion or pneumothorax. No acute osseous findings are demonstrated. There are mild thoracic  spine degenerative changes. IMPRESSION: Stable chest without evidence of active cardiopulmonary process. Aortic Atherosclerosis (ICD10-I70.0). Electronically Signed   By: Carey Bullocks M.D.   On: 12/26/2018 13:21    Procedures Procedures (including critical care time)  Medications Ordered in ED Medications  acetaminophen (TYLENOL) tablet 650 mg (650 mg Oral Given 12/26/18 1355)  benzonatate (TESSALON) capsule 200 mg (200 mg Oral Given 12/26/18 1356)     Initial Impression / Assessment and Plan / ED Course  I have reviewed the triage vital signs and the nursing notes.  Pertinent labs & imaging results that were available during my care of the patient were reviewed by me and considered in my medical decision making (see chart for details).     Patient with symptoms consistent with influenza.  Flu test was positive here.  Vitals are stable, low-grade fever.  No signs of dehydration, tolerating PO's.  CXR unremarkable.  Discussed the cost versus benefit of Tamiflu treatment with the patient.  Because of her age and chronic medical issues, will treat with Tamiflu.  I have advised patient to inform her fianc as she sees necessary for prophylactic treatment with Tamiflu if needed as determined by his bad coordinator.  Advised to wear a mask around all chronically ill people. Patient will be discharged with instructions to orally hydrate, rest, and use over-the-counter medications such as anti-inflammatories ibuprofen and Aleve for muscle aches and  Tylenol for fever.  Patient will also be given a cough suppressant, other symptomatic treatment. Patient discussed with and seen by my attending, Dr. Madilyn Hook.  Patient is hemodynamically stable, in NAD, and able to ambulate in the ED. Evaluation does not show pathology that would require ongoing emergent intervention or inpatient treatment. I explained the diagnosis to the patient. Pain has been managed and has no complaints prior to discharge. Patient is comfortable with above plan and is stable for discharge at this time. All questions were answered prior to disposition. Strict return precautions for returning to the ED were discussed. Encouraged follow up with PCP.    Portions of this note were generated with Scientist, clinical (histocompatibility and immunogenetics). Dictation errors may occur despite best attempts at proofreading.    Final Clinical Impressions(s) / ED Diagnoses   Final diagnoses:  Influenza-like illness    ED Discharge Orders         Ordered    oseltamivir (TAMIFLU) 75 MG capsule  Every 12 hours     12/26/18 1458    benzonatate (TESSALON) 100 MG capsule  Every 8 hours     12/26/18 1458    fluticasone (FLONASE) 50 MCG/ACT nasal spray  Daily     12/26/18 1458    acetaminophen (TYLENOL) 325 MG tablet  Every 6 hours PRN     12/26/18 1458           Dietrich Pates, PA-C 12/26/18 1502    Tilden Fossa, MD 12/26/18 1820

## 2019-09-01 ENCOUNTER — Emergency Department (HOSPITAL_COMMUNITY): Payer: Medicare HMO

## 2019-09-01 ENCOUNTER — Emergency Department (HOSPITAL_COMMUNITY)
Admission: EM | Admit: 2019-09-01 | Discharge: 2019-09-01 | Disposition: A | Discharge status: Home / Self Care | Payer: Medicare HMO | Attending: Emergency Medicine | Admitting: Emergency Medicine

## 2019-09-01 DIAGNOSIS — F17210 Nicotine dependence, cigarettes, uncomplicated: Secondary | ICD-10-CM | POA: Insufficient documentation

## 2019-09-01 DIAGNOSIS — Y999 Unspecified external cause status: Secondary | ICD-10-CM | POA: Diagnosis not present

## 2019-09-01 DIAGNOSIS — Z7982 Long term (current) use of aspirin: Secondary | ICD-10-CM | POA: Insufficient documentation

## 2019-09-01 DIAGNOSIS — M25512 Pain in left shoulder: Secondary | ICD-10-CM | POA: Diagnosis not present

## 2019-09-01 DIAGNOSIS — Y93I9 Activity, other involving external motion: Secondary | ICD-10-CM | POA: Insufficient documentation

## 2019-09-01 DIAGNOSIS — I10 Essential (primary) hypertension: Secondary | ICD-10-CM | POA: Diagnosis not present

## 2019-09-01 DIAGNOSIS — Y92410 Unspecified street and highway as the place of occurrence of the external cause: Secondary | ICD-10-CM | POA: Diagnosis not present

## 2019-09-01 DIAGNOSIS — E119 Type 2 diabetes mellitus without complications: Secondary | ICD-10-CM | POA: Diagnosis not present

## 2019-09-01 DIAGNOSIS — Z7984 Long term (current) use of oral hypoglycemic drugs: Secondary | ICD-10-CM | POA: Diagnosis not present

## 2019-09-01 DIAGNOSIS — Z79899 Other long term (current) drug therapy: Secondary | ICD-10-CM | POA: Insufficient documentation
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
## 2019-09-01 NOTE — ED Notes (Signed)
Pt is in room 35 with husband.

## 2019-09-01 NOTE — ED Notes (Signed)
Patient was called 3x's no Answer.

## 2019-09-01 NOTE — ED Notes (Signed)
Patient transported to X-ray 

## 2019-09-01 NOTE — ED Notes (Signed)
Patient verbalizes understanding of discharge instructions. Opportunity for questioning and answers were provided. Armband removed by staff, pt discharged from ED ambulatory to home.  
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
## 2020-01-28 ENCOUNTER — Ambulatory Visit: Payer: Medicare HMO | Attending: Internal Medicine

## 2020-01-28 DIAGNOSIS — Z23 Encounter for immunization: Secondary | ICD-10-CM

## 2020-01-28 NOTE — Progress Notes (Signed)
   Covid-19 Vaccination Clinic  Name:  Leah Thomas    MRN: [id number] DOB: [birth year]  01/28/2020  [name] was observed post Covid-19 immunization for 15 minutes without incident. She was provided with Vaccine Information Sheet and instruction to access the V-Safe system.   [name] was instructed to call 911 with any severe reactions post vaccine: Marland Kitchen Difficulty breathing  . Swelling of face and throat  . A fast heartbeat  . A bad rash all over body  . Dizziness and weakness   Immunizations Administered    Name Date Dose VIS Date Route   Pfizer COVID-19 Vaccine 01/28/2020  9:21 AM 0.3 mL 10/30/2019 Intramuscular   Manufacturer: [employer], [employer]   Lot: [id number]   NDC: [address]-2      Covid-19 Vaccination Clinic  Name:  Leah Thomas    MRN: [id number] DOB: 07-22-1951  01/28/2020  [name] was observed post Covid-19 immunization for 15 minutes without incident. She was provided with Vaccine Information Sheet and instruction to access the V-Safe system.   [name] was instructed to call 911 with any severe reactions post vaccine: Marland Kitchen Difficulty breathing  . Swelling of face and throat  . A fast heartbeat  . A bad rash all over body  . Dizziness and weakness   Immunizations Administered    Name Date Dose VIS Date Route   Pfizer COVID-19 Vaccine 01/28/2020  9:21 AM 0.3 mL 10/30/2019 Intramuscular   Manufacturer: [employer], [employer]   Lot: [id number]   NDC: [address]-5

## 2020-02-22 ENCOUNTER — Ambulatory Visit: Payer: Medicare HMO | Attending: Internal Medicine

## 2020-02-22 DIAGNOSIS — Z23 Encounter for immunization: Secondary | ICD-10-CM

## 2020-02-22 NOTE — Progress Notes (Signed)
   Covid-19 Vaccination Clinic  Name:  Leah Thomas    MRN: [id number] DOB: 12-06-1950  02/22/2020  Leah Thomas was observed post Covid-19 immunization for 15 minutes without incident. She was provided with Vaccine Information Sheet and instruction to access the V-Safe system.   Leah Thomas was instructed to call 911 with any severe reactions post vaccine: Marland Kitchen Difficulty breathing  . Swelling of face and throat  . A fast heartbeat  . A bad rash all over body  . Dizziness and weakness   Immunizations Administered    Name Date Dose VIS Date Route   Pfizer COVID-19 Vaccine 02/22/2020  9:51 AM 0.3 mL 10/30/2019 Intramuscular   Manufacturer: [employer], [employer]   Lot: [id number]   NDC: [address]-8

## 2020-04-20 ENCOUNTER — Ambulatory Visit: Payer: Medicare HMO | Attending: Internal Medicine

## 2020-04-20 ENCOUNTER — Other Ambulatory Visit: Payer: Self-pay

## 2020-04-20 DIAGNOSIS — Z20822 Contact with and (suspected) exposure to covid-19: Secondary | ICD-10-CM

## 2020-04-21 LAB — SARS-COV-2, NAA 2 DAY TAT

## 2020-04-21 LAB — NOVEL CORONAVIRUS, NAA: SARS-CoV-2, NAA: NOT DETECTED
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
## 2022-01-31 ENCOUNTER — Ambulatory Visit (HOSPITAL_COMMUNITY)
Admission: RE | Admit: 2022-01-31 | Discharge: 2022-01-31 | Disposition: A | Discharge status: Home / Self Care | Payer: Medicare Other | Source: Ambulatory Visit | Attending: Family Medicine | Admitting: Family Medicine

## 2022-01-31 ENCOUNTER — Other Ambulatory Visit: Payer: Self-pay

## 2022-01-31 ENCOUNTER — Encounter (HOSPITAL_COMMUNITY): Payer: Self-pay

## 2022-01-31 VITALS — BP 141/77 | HR 96 | Temp 98.8°F | Resp 19

## 2022-01-31 DIAGNOSIS — B029 Zoster without complications: Secondary | ICD-10-CM | POA: Diagnosis not present

## 2022-01-31 MED ORDER — CAPSAICIN 0.075 % EX CREA: 1.0000 "application " | TOPICAL_CREAM | Freq: Two times a day (BID) | CUTANEOUS | 0 refills | Status: DC | PRN

## 2022-01-31 MED ORDER — VALACYCLOVIR HCL 1 G PO TABS: 1000.0000 mg | ORAL_TABLET | Freq: Three times a day (TID) | ORAL | 0 refills | Status: DC

## 2022-01-31 NOTE — ED Triage Notes (Signed)
Pt c/o rash that started on Friday left upper chest. Reports that started out bump like then opened up and had some pus like drainage. Pt hx shingles and believes could be that. Pt has area covered with guaze in triage due to irritation.  ?

## 2022-02-01 NOTE — ED Provider Notes (Signed)
?  [facility] ? ? ?[id number] ?01/31/22 Arrival Time: 1545 ? ?ASSESSMENT & PLAN: ? ?1. Herpes zoster without complication   ? ?Begin: ?Meds ordered this encounter  ?Medications  ? valACYclovir (VALTREX) 1000 MG tablet  ?  Sig: Take 1 tablet (1,000 mg total) by mouth 3 (three) times daily.  ?  Dispense:  21 tablet  ?  Refill:  0  ? capsicum (ZOSTRIX) 0.075 % topical cream  ?  Sig: Apply 1 application. topically 2 (two) times daily as needed.  ?  Dispense:  28.3 g  ?  Refill:  0  ? ?No signs of bacterial skin infection. ? ?Will follow up with PCP or here if worsening or failing to improve as anticipated. ?Reviewed expectations re: course of current medical issues. Questions answered. ?Outlined signs and symptoms indicating need for more acute intervention. ?Patient verbalized understanding. ?After Visit Summary given. ? ? ?SUBJECTIVE: ? ?Leah Thomas is a 71 y.o. female who presents with a skin complaint. "Rash" noted on R upper chest wall approx 4 d ago; "tingling sensation" before seeing rash. Painful now. Slight watery drainage. Afebrile. H/O shingles "that felt the same." No tx PTA. ? ? ? ?OBJECTIVE: ?Vitals:  ? 01/31/22 1625  ?BP: (!) 141/77  ?Pulse: 96  ?Resp: 19  ?Temp: 98.8 ?F (37.1 ?C)  ?TempSrc: Oral  ?SpO2: 99%  ?  ?General appearance: alert; no distress ?HEENT: [place]; AT ?Neck: supple with FROM ?Lungs: clear to auscultation bilaterally ?Extremities: no edema; moves all extremities normally ?Skin: warm and dry; signs of infection: no; small crop of red/purplish papules/vesicles over R upper chest wall with some crusting ?Psychological: alert and cooperative; normal mood and affect ? ?Allergies  ?Allergen Reactions  ? Benadryl [Diphenhydramine Hcl] Hives  ? Dilantin [Phenytoin] Hives  ? ? ?Past Medical History:  ?Diagnosis Date  ? Bursitis   ? Diabetes mellitus without complication (HCC)   ? Hypertension   ? Paget disease of bone   ? ?Social History  ? ?Socioeconomic History  ? Marital status:  Divorced  ?  Spouse name: Not on file  ? Number of children: Not on file  ? Years of education: Not on file  ? Highest education level: Not on file  ?Occupational History  ? Not on file  ?Tobacco Use  ? Smoking status: Every Day  ?  Packs/day: 1.00  ?  Years: 10.00  ?  Pack years: 10.00  ?  Types: Cigarettes  ? Smokeless tobacco: Never  ?Substance and Sexual Activity  ? Alcohol use: No  ? Drug use: No  ? Sexual activity: Not on file  ?Other Topics Concern  ? Not on file  ?Social History Narrative  ? Not on file  ? ?Social Determinants of Health  ? ?Financial Resource Strain: Not on file  ?Food Insecurity: Not on file  ?Transportation Needs: Not on file  ?Physical Activity: Not on file  ?Stress: Not on file  ?Social Connections: Not on file  ?Intimate Partner Violence: Not on file  ? ?No family history on file. ?Past Surgical History:  ?Procedure Laterality Date  ? ABDOMINAL HYSTERECTOMY    ? CHOLECYSTECTOMY    ? ? ?  ?Mardella Layman, MD ?02/01/22 [phone] ? ?

## 2022-06-28 ENCOUNTER — Ambulatory Visit
Admission: EM | Admit: 2022-06-28 | Discharge: 2022-06-28 | Disposition: A | Discharge status: Home / Self Care | Payer: Medicare Other | Attending: Physician Assistant | Admitting: Physician Assistant

## 2022-06-28 DIAGNOSIS — M545 Low back pain, unspecified: Secondary | ICD-10-CM | POA: Diagnosis not present

## 2022-06-28 LAB — POCT URINALYSIS DIP (MANUAL ENTRY)
Bilirubin, UA: NEGATIVE
Glucose, UA: 500 mg/dL — AB
Ketones, POC UA: NEGATIVE mg/dL
Leukocytes, UA: NEGATIVE
Nitrite, UA: NEGATIVE
Protein Ur, POC: NEGATIVE mg/dL
Spec Grav, UA: 1.015 (ref 1.010–1.025)
Urobilinogen, UA: 0.2 E.U./dL
pH, UA: 7 (ref 5.0–8.0)

## 2022-06-28 MED ORDER — TIZANIDINE HCL 4 MG PO TABS: 4.0000 mg | ORAL_TABLET | Freq: Four times a day (QID) | ORAL | 0 refills | Status: DC | PRN

## 2022-06-28 MED ORDER — PREDNISONE 20 MG PO TABS: 40.0000 mg | ORAL_TABLET | Freq: Every day | ORAL | 0 refills | Status: AC

## 2022-06-28 NOTE — ED Triage Notes (Signed)
[facility] to [facility] with pain and discomfort in back radiating to side mainly on the right side. Pt st she has been using tylenol and salon patches with out any improvement in symptoms.

## 2022-06-28 NOTE — ED Provider Notes (Signed)
[facility]    CSN: [id number] Arrival date & time: 06/28/22  5035      History   Chief Complaint Chief Complaint  Patient presents with   Back Pain    HPI Leah Thomas is a 71 y.o. female.   Patient here today for evaluation of right-sided back pain that started about a week ago after she had moved a cabinet in her home.  She reports that pain started the day after she had been trying to clean behind a cabinet and notes she has had continued pain since that time.  She states that her muscle on the right feels swollen.  She does have numbness and tingling at baseline but nothing new or worse.  She has tried using Tylenol and topical patches without significant improvement.  The history is provided by the patient.    Past Medical History:  Diagnosis Date   Bursitis    Diabetes mellitus without complication (HCC)    Hypertension    Paget disease of bone     Patient Active Problem List   Diagnosis Date Noted   Paget disease of bone    Bursitis     Past Surgical History:  Procedure Laterality Date   ABDOMINAL HYSTERECTOMY     CHOLECYSTECTOMY      OB History   No obstetric history on file.      Home Medications    Prior to Admission medications   Medication Sig Start Date End Date Taking? Authorizing Provider  predniSONE (DELTASONE) 20 MG tablet Take 2 tablets (40 mg total) by mouth daily with breakfast for 5 days. 06/28/22 07/03/22 Yes Tomi Bamberger, PA-C  tiZANidine (ZANAFLEX) 4 MG tablet Take 1 tablet (4 mg total) by mouth every 6 (six) hours as needed for muscle spasms. 06/28/22  Yes Tomi Bamberger, PA-C  acetaminophen (TYLENOL) 325 MG tablet Take 2 tablets (650 mg total) by mouth every 6 (six) hours as needed. 12/26/18   Khatri, Hina, PA-C  amLODipine (NORVASC) 5 MG tablet Take 5 mg by mouth daily.    [provider]  aspirin 81 MG tablet Take 81 mg by mouth daily.    [provider]  atorvastatin (LIPITOR) 20 MG tablet  Take 20 mg by mouth daily.    [provider]  benzonatate (TESSALON) 100 MG capsule Take 2 capsules (200 mg total) by mouth every 8 (eight) hours. 12/26/18   Khatri, Hina, PA-C  Calcium Citrate (CITRACAL PO) Take 2 tablets by mouth daily.    [provider]  capsicum (ZOSTRIX) 0.075 % topical cream Apply 1 application. topically 2 (two) times daily as needed. 01/31/22   Mardella Layman, MD  Cyanocobalamin (B-12) 500 MCG TABS Take by mouth.    [provider]  diazepam (VALIUM) 5 MG tablet Take 1 tablet (5 mg total) by mouth every 8 (eight) hours as needed (muscle spasm). 09/01/12   Pollyann Savoy, MD  fluconazole (DIFLUCAN) 150 MG tablet Take 1 tablet (150 mg total) by mouth daily. Take 1 tab today; repeat in 72 hours 07/04/16   Lucia Estelle, NP  fluticasone [facility]) 50 MCG/ACT nasal spray Place 1 spray into both nostrils daily. 12/26/18   Khatri, Hina, PA-C  hydrochlorothiazide (HYDRODIURIL) 25 MG tablet Take 25 mg by mouth daily.    [provider]  HYDROcodone-acetaminophen (NORCO/VICODIN) 5-325 MG per tablet Take 1 tablet by mouth every 6 (six) hours as needed. For pain    [provider]  lisinopril (PRINIVIL,ZESTRIL)  20 MG tablet Take 40 mg by mouth 2 (two) times daily.    [provider]  losartan (COZAAR) 100 MG tablet Take 100 mg by mouth daily.    [provider]  metFORMIN (GLUMETZA) 500 MG (MOD) 24 hr tablet Take 1,000 mg by mouth 2 (two) times daily with a meal.    [provider]  naproxen sodium (ANAPROX) 220 MG tablet Take 220 mg by mouth daily as needed. For pain    [provider]  oseltamivir (TAMIFLU) 75 MG capsule Take 1 capsule (75 mg total) by mouth every 12 (twelve) hours. 12/26/18   Khatri, Hina, PA-C  oxyCODONE-acetaminophen (PERCOCET/ROXICET) 5-325 MG per tablet Take 1-2 tablets by mouth every 6 (six) hours as needed for pain. 09/01/12   Pollyann Savoy, MD  saxagliptin HCl (ONGLYZA) 5 MG TABS  tablet Take 5 mg by mouth daily.    [provider]  traMADol (ULTRAM) 50 MG tablet Take 50 mg by mouth every 6 (six) hours as needed. For pain    [provider]  valACYclovir (VALTREX) 1000 MG tablet Take 1 tablet (1,000 mg total) by mouth 3 (three) times daily. 01/31/22   Mardella Layman, MD    Family History No family history on file.  Social History Social History   Tobacco Use   Smoking status: Every Day    Packs/day: 1.00    Years: 10.00    Total pack years: 10.00    Types: Cigarettes   Smokeless tobacco: Never  Substance Use Topics   Alcohol use: No   Drug use: No     Allergies   Benadryl [diphenhydramine hcl] and Dilantin [phenytoin]   Review of Systems Review of Systems  Constitutional:  Negative for chills and fever.  Eyes:  Negative for discharge and redness.  Gastrointestinal:  Negative for abdominal pain, nausea and vomiting.  Musculoskeletal:  Positive for back pain and myalgias.     Physical Exam Triage Vital Signs ED Triage Vitals  Enc Vitals Group     BP      Pulse      Resp      Temp      Temp src      SpO2      Weight      Height      Head Circumference      Peak Flow      Pain Score      Pain Loc      Pain Edu?      Excl. in GC?    No data found.  Updated Vital Signs BP (!) 155/90   Pulse 70   Temp 97.9 F (36.6 C)   Resp 18   SpO2 99%      Physical Exam Vitals and nursing note reviewed.  Constitutional:      General: She is not in acute distress.    Appearance: Normal appearance. She is not ill-appearing.  HENT:     Head: Normocephalic and atraumatic.  Eyes:     Conjunctiva/sclera: Conjunctivae normal.  Cardiovascular:     Rate and Rhythm: Normal rate.  Pulmonary:     Effort: Pulmonary effort is normal.  Musculoskeletal:     Comments: No tenderness to palpation of midline spine, muscle spasm noted to right lumbar area  Neurological:     Mental Status: She is alert.  Psychiatric:        Mood and  Affect: Mood normal.        Behavior:  Behavior normal.        Thought Content: Thought content normal.      UC Treatments / Results  Labs (all labs ordered are listed, but only abnormal results are displayed) Labs Reviewed  POCT URINALYSIS DIP (MANUAL ENTRY) - Abnormal; Notable for the following components:      Result Value   Glucose, UA =500 (*)    Blood, UA trace-intact (*)    All other components within normal limits    EKG   Radiology No results found.  Procedures Procedures (including critical care time)  Medications Ordered in UC Medications - No data to display  Initial Impression / Assessment and Plan / UC Course  I have reviewed the triage vital signs and the nursing notes.  Pertinent labs & imaging results that were available during my care of the patient were reviewed by me and considered in my medical decision making (see chart for details).    Suspect muscle strain and will treat with steroids and muscle relaxer.  Discussed using warm compresses or heating pad to help with relaxing muscle as well.  Advised muscle relaxer may cause drowsiness and to use with caution.  Encouraged follow-up if no gradual improvement or if symptoms worsen.  Final Clinical Impressions(s) / UC Diagnoses   Final diagnoses:  Acute right-sided low back pain without sciatica   Discharge Instructions   None    ED Prescriptions     Medication Sig Dispense Auth. Provider   predniSONE (DELTASONE) 20 MG tablet Take 2 tablets (40 mg total) by mouth daily with breakfast for 5 days. 10 tablet Erma Pinto F, PA-C   tiZANidine (ZANAFLEX) 4 MG tablet Take 1 tablet (4 mg total) by mouth every 6 (six) hours as needed for muscle spasms. 30 tablet Tomi Bamberger, PA-C      PDMP not reviewed this encounter.   Tomi Bamberger, PA-C 06/28/22 [phone]

## 2022-08-16 ENCOUNTER — Ambulatory Visit
Admission: EM | Admit: 2022-08-16 | Discharge: 2022-08-16 | Disposition: A | Discharge status: Home / Self Care | Payer: Medicare Other | Attending: Physician Assistant | Admitting: Physician Assistant

## 2022-08-16 DIAGNOSIS — M109 Gout, unspecified: Secondary | ICD-10-CM

## 2022-08-16 DIAGNOSIS — M25532 Pain in left wrist: Secondary | ICD-10-CM | POA: Diagnosis not present

## 2022-08-16 MED ORDER — IBUPROFEN 600 MG PO TABS: 600.0000 mg | ORAL_TABLET | Freq: Three times a day (TID) | ORAL | 0 refills | Status: DC

## 2022-08-16 MED ORDER — PREDNISONE 10 MG PO TABS: 10.0000 mg | ORAL_TABLET | Freq: Three times a day (TID) | ORAL | 0 refills | Status: DC

## 2022-08-16 NOTE — ED Provider Notes (Signed)
[facility]    CSN: [id number] Arrival date & time: 08/16/22  1355      History   Chief Complaint Chief Complaint  Patient presents with   Hand Pain    HPI Leah Thomas is a 71 y.o. female.   71 year old presents with left pain.  She indicates that 3 days ago she woke up and her left wrist started hurting, painful with motion.  Patient indicates that she did not hurt her wrist, hit her wrist or sprained her wrist.  Relates she has not been bitten by any insects.  She indicates that her left wrist has become swollen, red, and tender.  She relates she cannot stand to touch the left wrist.  She indicates that she has limited motion due to the pain.  She is taking some arthritis Tylenol which has improved the pain a little bit.  Patient indicates she does not have a history of gout, but her mother did.  She indicates that she did eat some pork neck on Sunday but no other portions of her diet have changed.  He does not have any fever or chills.   Hand Pain    Past Medical History:  Diagnosis Date   Bursitis    Diabetes mellitus without complication (HCC)    Hypertension    Paget disease of bone     Patient Active Problem List   Diagnosis Date Noted   Paget disease of bone    Bursitis     Past Surgical History:  Procedure Laterality Date   ABDOMINAL HYSTERECTOMY     CHOLECYSTECTOMY      OB History   No obstetric history on file.      Home Medications    Prior to Admission medications   Medication Sig Start Date End Date Taking? Authorizing Provider  ibuprofen (ADVIL) 600 MG tablet Take 1 tablet (600 mg total) by mouth 3 (three) times daily. 08/16/22  Yes Ellsworth Lennox, PA-C  predniSONE (DELTASONE) 10 MG tablet Take 1 tablet (10 mg total) by mouth 3 (three) times daily. 08/16/22  Yes Ellsworth Lennox, PA-C  acetaminophen (TYLENOL) 325 MG tablet Take 2 tablets (650 mg total) by mouth every 6 (six) hours as needed. 12/26/18   Khatri, Hina, PA-C  amLODipine  (NORVASC) 5 MG tablet Take 5 mg by mouth daily.    [provider]  aspirin 81 MG tablet Take 81 mg by mouth daily.    [provider]  atorvastatin (LIPITOR) 20 MG tablet Take 20 mg by mouth daily.    [provider]  Calcium Citrate (CITRACAL PO) Take 2 tablets by mouth daily.    [provider]  capsicum (ZOSTRIX) 0.075 % topical cream Apply 1 application. topically 2 (two) times daily as needed. 01/31/22   Mardella Layman, MD  Cyanocobalamin (B-12) 500 MCG TABS Take by mouth.    [provider]  hydrochlorothiazide (HYDRODIURIL) 25 MG tablet Take 25 mg by mouth daily.    [provider]  lisinopril (PRINIVIL,ZESTRIL) 20 MG tablet Take 40 mg by mouth 2 (two) times daily.    [provider]  losartan (COZAAR) 100 MG tablet Take 100 mg by mouth daily.    [provider]  metFORMIN (GLUMETZA) 500 MG (MOD) 24 hr tablet Take 1,000 mg by mouth 2 (two) times daily with a meal.    [provider]  naproxen sodium (ANAPROX) 220 MG tablet Take 220 mg by mouth daily as needed. For pain  [provider]  oseltamivir (TAMIFLU) 75 MG capsule Take 1 capsule (75 mg total) by mouth every 12 (twelve) hours. 12/26/18   Khatri, Hina, PA-C  saxagliptin HCl (ONGLYZA) 5 MG TABS tablet Take 5 mg by mouth daily.    [provider]  traMADol (ULTRAM) 50 MG tablet Take 50 mg by mouth every 6 (six) hours as needed. For pain    [provider]    Family History History reviewed. No pertinent family history.  Social History Social History   Tobacco Use   Smoking status: Every Day    Packs/day: 1.00    Years: 10.00    Total pack years: 10.00    Types: Cigarettes   Smokeless tobacco: Never  Substance Use Topics   Alcohol use: No   Drug use: No     Allergies   Benadryl [diphenhydramine hcl] and Dilantin [phenytoin]   Review of Systems Review of Systems  Musculoskeletal:  Positive for joint swelling  (left wrist pain and swelling).     Physical Exam Triage Vital Signs ED Triage Vitals  Enc Vitals Group     BP 08/16/22 1430 135/84     Pulse Rate 08/16/22 1430 96     Resp 08/16/22 1430 18     Temp 08/16/22 1430 98.2 F (36.8 C)     Temp Source 08/16/22 1430 Oral     SpO2 08/16/22 1430 97 %     Weight --      Height --      Head Circumference --      Peak Flow --      Pain Score 08/16/22 1431 8     Pain Loc --      Pain Edu? --      Excl. in GC? --    No data found.  Updated Vital Signs BP 135/84 (BP Location: Left Arm)   Pulse 96   Temp 98.2 F (36.8 C) (Oral)   Resp 18   SpO2 97%   Visual Acuity Right Eye Distance:   Left Eye Distance:   Bilateral Distance:    Right Eye Near:   Left Eye Near:    Bilateral Near:     Physical Exam Constitutional:      Appearance: Normal appearance.  Musculoskeletal:       Arms:     Comments: Left wrist: The radial aspect of the wrist area has 1+ swelling with localized redness across the wrist joint posteriorly.  Range of motion is limited with pain on flexion and extension.  Supination and pronation's are normal.  There is tenderness palpated along the radial wrist area.  There is no streaking.  Neurological:     Mental Status: She is alert.      UC Treatments / Results  Labs (all labs ordered are listed, but only abnormal results are displayed) Labs Reviewed  URIC ACID    EKG   Radiology No results found.  Procedures Procedures (including critical care time)  Medications Ordered in UC Medications - No data to display  Initial Impression / Assessment and Plan / UC Course  I have reviewed the triage vital signs and the nursing notes.  Pertinent labs & imaging results that were available during my care of the patient were reviewed by me and considered in my medical decision making (see chart for details).    Plan: 1.  The acute left wrist gout will be treated with the following: A.  Uric acid level is  pending due  to symptoms of gout and no prior history. B.  Ibuprofen 600 mg every 8 hours with food to treat the gouty arthritic flare. C.  Prednisone 10 mg 3 times a day for 5 days only to treat the acute gouty flare. D.  Advised to follow-up with PCP or return to urgent care if symptoms fail to improve. Final Clinical Impressions(s) / UC Diagnoses   Final diagnoses:  Left wrist pain  Acute gout of left wrist, unspecified cause     Discharge Instructions      Uric acid level that test for gout will be completed in 48 hours or less.  If you do not get a call from this office that indicates the lab is normal.  Go on MyChart to view the results when a post in 24 hours.  Advised to take the ibuprofen 600 mg every 8 hours with food to help relieve the pain and swelling. Advised take prednisone 10 mg 3 times a day for 5 days only to help reduce the acute inflammatory process. Advise to follow-up with PCP or return to urgent care if symptoms fail to improve.    ED Prescriptions     Medication Sig Dispense Auth. Provider   predniSONE (DELTASONE) 10 MG tablet Take 1 tablet (10 mg total) by mouth 3 (three) times daily. 15 tablet Nyoka Lint, PA-C   ibuprofen (ADVIL) 600 MG tablet Take 1 tablet (600 mg total) by mouth 3 (three) times daily. 30 tablet Nyoka Lint, PA-C      PDMP not reviewed this encounter.   Nyoka Lint, PA-C 08/16/22 1503

## 2022-08-16 NOTE — Discharge Instructions (Addendum)
Uric acid level that test for gout will be completed in 48 hours or less.  If you do not get a call from this office that indicates the lab is normal.  Go on MyChart to view the results when a post in 24 hours.  Advised to take the ibuprofen 600 mg every 8 hours with food to help relieve the pain and swelling. Advised take prednisone 10 mg 3 times a day for 5 days only to help reduce the acute inflammatory process. Advise to follow-up with PCP or return to urgent care if symptoms fail to improve.

## 2022-08-16 NOTE — ED Triage Notes (Signed)
Pt c/o knot to lt hand x3 days with decrease movement. Denies injury. States taking tylenol and using pain patches with no relief.

## 2022-08-17 LAB — URIC ACID: Uric Acid: 4 mg/dL (ref 3.1–7.9)

## 2022-11-14 ENCOUNTER — Ambulatory Visit
Admission: EM | Admit: 2022-11-14 | Discharge: 2022-11-14 | Disposition: A | Discharge status: Home / Self Care | Payer: Medicare Other | Attending: Emergency Medicine | Admitting: Emergency Medicine

## 2022-11-14 ENCOUNTER — Ambulatory Visit (INDEPENDENT_AMBULATORY_CARE_PROVIDER_SITE_OTHER): Payer: Medicare Other

## 2022-11-14 DIAGNOSIS — M5441 Lumbago with sciatica, right side: Secondary | ICD-10-CM

## 2022-11-14 DIAGNOSIS — M5442 Lumbago with sciatica, left side: Secondary | ICD-10-CM | POA: Diagnosis not present

## 2022-11-14 DIAGNOSIS — M545 Low back pain, unspecified: Secondary | ICD-10-CM

## 2022-11-14 MED ORDER — TIZANIDINE HCL 4 MG PO TABS: 4.0000 mg | ORAL_TABLET | Freq: Three times a day (TID) | ORAL | 0 refills | Status: DC | PRN

## 2022-11-14 MED ORDER — ACETAMINOPHEN 325 MG PO TABS
975.0000 mg | ORAL_TABLET | Freq: Once | ORAL | Status: AC
  Administered 2022-11-14: 975 mg via ORAL

## 2022-11-14 MED ORDER — NAPROXEN 375 MG PO TABS: 375.0000 mg | ORAL_TABLET | Freq: Two times a day (BID) | ORAL | 0 refills | Status: AC

## 2022-11-14 MED ORDER — KETOROLAC TROMETHAMINE 30 MG/ML IJ SOLN
30.0000 mg | Freq: Once | INTRAMUSCULAR | Status: AC
  Administered 2022-11-14: 30 mg via INTRAMUSCULAR

## 2022-11-14 NOTE — Discharge Instructions (Addendum)
Continue heat, at the Naprosyn to Tylenol.  Gentle stretching, Epsom salt soaks can be helpful as well.  Zanaflex for muscle spasms.  I have tried to put in an urgent referral to neurosurgery, but I am not sure if that is going to work.  You may need to get it from your primary care provider.

## 2022-11-14 NOTE — ED Provider Notes (Signed)
HPI  SUBJECTIVE:  Leah Thomas is a 71 y.o. female who presents with intermittent, hours long spasm-like midline low back pain with radiation down both of her legs for the past 6 days.  She reports numbness and tingling in the sciatic distribution on the right side.  She reports that she is having difficulty making it to the bathroom in time, but she still has some control over her urine.  She states that this is new since the back pain started.  She reports right leg weakness secondary to pain.  No recent lifting, change in physical activity, trauma to the area.  No fevers, saddle anesthesia, pain worse at night, urinary retention, fecal incontinence.  She has tried heating pad and Tylenol with temporary improvement in her symptoms.  Symptoms are worse with bending forward, large positional changes and with turning over in bed.  No antipyretic in the past 6 hours.  She saw her PCP last week on 12/21 for this, was prescribed Neurontin 100 mg 3 times daily which she states is not working.  Outside chart review shows that she has a diagnosis of chronic low back pain with sciatica worse on the right, diclofenac has helped in the past and plan was to refill this. she was started on Neurontin and Flexeril.  They decided against a prednisone burst, and plan was to follow-up with pain management.  She was also noted to have urinary urgency on that visit.  UA was negative for UTI.  Past medical history of Paget's disease, diabetes, hypertension.  PCP: [facility]   Past Medical History:  Diagnosis Date   Bursitis    Diabetes mellitus without complication ([place])    Hypertension    Paget disease of bone     Past Surgical History:  Procedure Laterality Date   ABDOMINAL HYSTERECTOMY     CHOLECYSTECTOMY      Family History  Family history unknown: Yes    Social History   Tobacco Use   Smoking status: Every Day    Packs/day: 1.00    Years: 10.00    Total pack years: 10.00    Types:  Cigarettes   Smokeless tobacco: Never  Substance Use Topics   Alcohol use: No   Drug use: No    No current facility-administered medications for this encounter.  Current Outpatient Medications:    naproxen (NAPROSYN) 375 MG tablet, Take 1 tablet (375 mg total) by mouth 2 (two) times daily for 7 days., Disp: 14 tablet, Rfl: 0   tiZANidine (ZANAFLEX) 4 MG tablet, Take 1 tablet (4 mg total) by mouth every 8 (eight) hours as needed for muscle spasms., Disp: 30 tablet, Rfl: 0   acetaminophen (TYLENOL) 325 MG tablet, Take 2 tablets (650 mg total) by mouth every 6 (six) hours as needed., Disp: 30 tablet, Rfl: 0   amLODipine (NORVASC) 5 MG tablet, Take 5 mg by mouth daily., Disp: , Rfl:    aspirin 81 MG tablet, Take 81 mg by mouth daily., Disp: , Rfl:    atorvastatin (LIPITOR) 20 MG tablet, Take 20 mg by mouth daily., Disp: , Rfl:    Calcium Citrate (CITRACAL PO), Take 2 tablets by mouth daily., Disp: , Rfl:    capsicum (ZOSTRIX) 0.075 % topical cream, Apply 1 application. topically 2 (two) times daily as needed., Disp: 28.3 g, Rfl: 0   Cyanocobalamin (B-12) 500 MCG TABS, Take by mouth., Disp: , Rfl:    hydrochlorothiazide (HYDRODIURIL) 25 MG tablet, Take 25 mg by mouth daily.,  Disp: , Rfl:    lisinopril (PRINIVIL,ZESTRIL) 20 MG tablet, Take 40 mg by mouth 2 (two) times daily., Disp: , Rfl:    losartan (COZAAR) 100 MG tablet, Take 100 mg by mouth daily., Disp: , Rfl:    metFORMIN (GLUMETZA) 500 MG (MOD) 24 hr tablet, Take 1,000 mg by mouth 2 (two) times daily with a meal., Disp: , Rfl:    predniSONE (DELTASONE) 10 MG tablet, Take 1 tablet (10 mg total) by mouth 3 (three) times daily., Disp: 15 tablet, Rfl: 0   saxagliptin HCl (ONGLYZA) 5 MG TABS tablet, Take 5 mg by mouth daily., Disp: , Rfl:    traMADol (ULTRAM) 50 MG tablet, Take 50 mg by mouth every 6 (six) hours as needed. For pain, Disp: , Rfl:   Allergies  Allergen Reactions   Benadryl [Diphenhydramine Hcl] Hives   Dilantin [Phenytoin]  Hives     ROS  As noted in HPI.   Physical Exam  BP 134/82 (BP Location: Right Arm)   Pulse 78   Temp 98.3 F (36.8 C) (Oral)   Resp 18   SpO2 99%   Constitutional: Well developed, well nourished, no acute distress Eyes:  EOMI, conjunctiva normal bilaterally HENT: Normocephalic, atraumatic,mucus membranes moist Respiratory: Normal inspiratory effort Cardiovascular: Normal rate GI: nondistended. No suprapubic tenderness skin: No rash, skin intact Musculoskeletal: no CVAT.  Bilateral paralumbar tenderness, positive muscle spasm.  Positive L-spine tenderness.  No SI joint, sacral bony tenderness. Bilateral lower extremities nontender, baseline ROM with intact DP pulses.  Sensation between thighs intact. No pain with passive int/ext rotation, flex hips, AD/ABduction bilaterally. SLR neg bilaterally. Sensation baseline light touch bilaterally for Pt, DTR's symmetric and intact bilaterally KJ, Motor symmetric bilateral 5/5 hip flexion, quadriceps, hamstrings, EHL, foot dorsiflexion, foot plantarflexion, gait somewhat antalgic but without apparent new ataxia. Neurologic: Alert & oriented x 3, no focal neuro deficits Psychiatric: Speech and behavior appropriate   ED Course   Medications  acetaminophen (TYLENOL) tablet 975 mg (975 mg Oral Given 11/14/22 1434)  ketorolac (TORADOL) 30 MG/ML injection 30 mg (30 mg Intramuscular Given 11/14/22 1434)    Orders Placed This Encounter  Procedures   DG Lumbar Spine Complete    Standing Status:   Standing    Number of Occurrences:   1    Order Specific Question:   Reason for Exam (SYMPTOM  OR DIAGNOSIS REQUIRED)    Answer:   Low back pain with bilateral radicular symptoms.  History of Paget's disease.  Rule out   Ambulatory referral to Neurosurgery    Referral Priority:   Urgent    Referral Type:   Surgical    Referral Reason:   Specialty Services Required    Referred to Provider:   Coletta Memos, MD    Requested Specialty:    Neurosurgery    Number of Visits Requested:   1    No results found for this or any previous visit (from the past 24 hour(s)). DG Lumbar Spine Complete  Result Date: 11/14/2022 CLINICAL DATA:  Low back pain with bilateral radicular symptoms for 5 days with sciatica. History of Paget's disease. EXAM: LUMBAR SPINE - COMPLETE 4+ VIEW COMPARISON:  Lumbar spine radiographs 09/01/2012. Lumbar MRI 09/09/2014. FINDINGS: There are 5 lumbar type vertebral bodies. Progressive convex right scoliosis centered at L3. The lateral alignment is near anatomic. There is no evidence of acute fracture or pars defect. Multilevel spondylosis with mild disc space narrowing and facet hypertrophy. Heterogeneous osseous sclerosis and expansion in the  left hemipelvis consistent with Paget's disease. Asymmetric left hip joint space narrowing is similar to prior studies. IMPRESSION: 1. No acute osseous findings. Progressive scoliosis and spondylosis. 2. Paget's disease in the left hemipelvis. 3. Asymmetric left hip joint space narrowing. Electronically Signed   By: Richardean Sale M.D.   On: 11/14/2022 13:45    ED Clinical Impression  1. Acute midline low back pain with bilateral sciatica     ED Assessment/Plan     Outside labs, records reviewed.  Calculated creatinine clearance from outside labs done 6 days ago 68 mL/min.  Patient has difficulty controlling her urine, but is able to do so.  She reports bilateral radicular pain.  Will image L-spine.  Imaging independently reviewed.  Multilevel spondylosis with mild disc space narrowing and facet hypertrophy.  Progressive right scoliosis.  No evidence of acute fracture.  See radiology report for details.  Giving Toradol 30 mg IM with 975 mg of Tylenol.  Do not need to renally adjust this.  We discussed a prednisone burst, however, she is on p.o. diabetic medications only, so she declined after discussing the risks and benefits of the steroid taper.  Will send home with  Zanaflex and a 7-day course of 375 mg Naprosyn combined with Tylenol.  Giving information of [place] [facility] and will attempt to place an urgent referral to them.  Discussed with patient that she may need to get a referral from her PCP.  ER return precautions given.  Discussed imaging, MDM, treatment plan, and plan for follow-up with patient. Discussed sn/sx that should prompt return to the ED. patient agrees with plan.   Meds ordered this encounter  Medications   acetaminophen (TYLENOL) tablet 975 mg   ketorolac (TORADOL) 30 MG/ML injection 30 mg   tiZANidine (ZANAFLEX) 4 MG tablet    Sig: Take 1 tablet (4 mg total) by mouth every 8 (eight) hours as needed for muscle spasms.    Dispense:  30 tablet    Refill:  0   naproxen (NAPROSYN) 375 MG tablet    Sig: Take 1 tablet (375 mg total) by mouth 2 (two) times daily for 7 days.    Dispense:  14 tablet    Refill:  0    *This clinic note was created using [employer]. Therefore, there may be occasional mistakes despite careful proofreading.  ?      Melynda Ripple, MD 11/15/22 [phone]

## 2022-11-14 NOTE — ED Triage Notes (Signed)
Pt presents with lower back pain that radiates down into both legs X 5 days.

## 2023-01-10 ENCOUNTER — Ambulatory Visit (INDEPENDENT_AMBULATORY_CARE_PROVIDER_SITE_OTHER): Payer: 59

## 2023-01-10 ENCOUNTER — Ambulatory Visit
Admission: EM | Admit: 2023-01-10 | Discharge: 2023-01-10 | Disposition: A | Discharge status: Home / Self Care | Payer: 59 | Attending: Family Medicine | Admitting: Family Medicine

## 2023-01-10 DIAGNOSIS — J069 Acute upper respiratory infection, unspecified: Secondary | ICD-10-CM | POA: Diagnosis not present

## 2023-01-10 DIAGNOSIS — R0602 Shortness of breath: Secondary | ICD-10-CM

## 2023-01-10 DIAGNOSIS — R059 Cough, unspecified: Secondary | ICD-10-CM

## 2023-01-10 MED ORDER — BENZONATATE 100 MG PO CAPS: 100.0000 mg | ORAL_CAPSULE | Freq: Three times a day (TID) | ORAL | 0 refills | Status: DC | PRN

## 2023-01-10 MED ORDER — CAPSAICIN 0.075 % EX CREA: 1.0000 | TOPICAL_CREAM | Freq: Two times a day (BID) | CUTANEOUS | 0 refills | Status: DC | PRN

## 2023-01-10 MED ORDER — VALACYCLOVIR HCL 1 G PO TABS: 1000.0000 mg | ORAL_TABLET | Freq: Three times a day (TID) | ORAL | 0 refills | Status: AC

## 2023-01-10 NOTE — Discharge Instructions (Signed)
Chest x-ray is clear and does not show pneumonia or fluid  Take benzonatate 100 mg, 1 tab every 8 hours as needed for cough.  Take valacyclovir 1000 mg--1 tablet 3 times daily for 7 days; this is for possible early shingles.  Please follow-up with your primary care.  If the burning pain in your left lower chest continues, it could be that she need treatment for chronic nerve pain from the history of shingles.

## 2023-01-10 NOTE — ED Triage Notes (Signed)
Pt states nasal congestion,sore throat and body aches for one week.  Also states she has a rash which she thinks is shingles under her left breast.

## 2023-01-10 NOTE — ED Provider Notes (Signed)
[facility]    CSN: [id number] Arrival date & time: 01/10/23  0800      History   Chief Complaint Chief Complaint  Patient presents with   Rash    HPI Leah Thomas is a 72 y.o. female.    Rash  Here with cough and congestion and hoarseness.  Symptoms began 7 to 8 days ago.  She has been feeling short of breath in the last few days.  She has some feeling of congestion in her throat and in her chest and her central anterior chest hurts some too  Also she has had some burning and tingling and itching in her left lower chest under her left breast.  She thinks it is shingles.  She has had shingles there about a year ago  No fever or chills.  She has had a lot of aching.  She has not done a home COVID swab   She does have a history of diabetes, and her sugar was 170 this morning last A1c was 7.7  Past Medical History:  Diagnosis Date   Bursitis    Diabetes mellitus without complication ([facility])    Hypertension    Paget disease of bone     Patient Active Problem List   Diagnosis Date Noted   Paget disease of bone    Bursitis     Past Surgical History:  Procedure Laterality Date   ABDOMINAL HYSTERECTOMY     CHOLECYSTECTOMY      OB History   No obstetric history on file.      Home Medications    Prior to Admission medications   Medication Sig Start Date End Date Taking? Authorizing Provider  benzonatate (TESSALON) 100 MG capsule Take 1 capsule (100 mg total) by mouth 3 (three) times daily as needed for cough. 01/10/23  Yes Barrett Henle, MD  valACYclovir (VALTREX) 1000 MG tablet Take 1 tablet (1,000 mg total) by mouth 3 (three) times daily for 7 days. 01/10/23 01/17/23 Yes Barrett Henle, MD  acetaminophen (TYLENOL) 325 MG tablet Take 2 tablets (650 mg total) by mouth every 6 (six) hours as needed. 12/26/18   Khatri, Hina, PA-C  amLODipine (NORVASC) 5 MG tablet Take 5 mg by mouth daily.    [provider]  aspirin 81 MG tablet Take  81 mg by mouth daily.    [provider]  atorvastatin (LIPITOR) 20 MG tablet Take 20 mg by mouth daily.    [provider]  Calcium Citrate (CITRACAL PO) Take 2 tablets by mouth daily.    [provider]  capsicum (ZOSTRIX) 0.075 % topical cream Apply 1 Application topically 2 (two) times daily as needed. 01/10/23   Barrett Henle, MD  Cyanocobalamin (B-12) 500 MCG TABS Take by mouth.    [provider]  hydrochlorothiazide (HYDRODIURIL) 25 MG tablet Take 25 mg by mouth daily.    [provider]  lisinopril (PRINIVIL,ZESTRIL) 20 MG tablet Take 40 mg by mouth 2 (two) times daily.    [provider]  losartan (COZAAR) 100 MG tablet Take 100 mg by mouth daily.    [provider]  metFORMIN (GLUMETZA) 500 MG (MOD) 24 hr tablet Take 1,000 mg by mouth 2 (two) times daily with a meal.    [provider]  saxagliptin HCl (ONGLYZA) 5 MG TABS tablet Take 5 mg by mouth daily.    [provider]  tiZANidine (ZANAFLEX) 4 MG tablet Take 1 tablet (4 mg total) by  mouth every 8 (eight) hours as needed for muscle spasms. 11/14/22   Melynda Ripple, MD  traMADol (ULTRAM) 50 MG tablet Take 50 mg by mouth every 6 (six) hours as needed. For pain    [provider]    Family History Family History  Family history unknown: Yes    Social History Social History   Tobacco Use   Smoking status: Every Day    Packs/day: 1.00    Years: 10.00    Total pack years: 10.00    Types: Cigarettes   Smokeless tobacco: Never  Substance Use Topics   Alcohol use: No   Drug use: No     Allergies   Benadryl [diphenhydramine hcl] and Dilantin [phenytoin]   Review of Systems Review of Systems  Skin:  Positive for rash.     Physical Exam Triage Vital Signs ED Triage Vitals  Enc Vitals Group     BP 01/10/23 0842 (!) 146/86     Pulse Rate 01/10/23 0842 92     Resp 01/10/23 0842 16     Temp 01/10/23 0842 98.3 F (36.8  C)     Temp Source 01/10/23 0842 Oral     SpO2 01/10/23 0842 97 %     Weight 01/10/23 0851 165 lb (74.8 kg)     Height 01/10/23 0851 5' 3"$  (1.6 m)     Head Circumference --      Peak Flow --      Pain Score 01/10/23 0850 0     Pain Loc --      Pain Edu? --      Excl. in [place]? --    No data found.  Updated Vital Signs BP (!) 146/86 (BP Location: Left Arm)   Pulse 92   Temp 98.3 F (36.8 C) (Oral)   Resp 16   Ht 5' 3"$  (1.6 m)   Wt 74.8 kg   SpO2 96%   BMI 29.23 kg/m   Visual Acuity Right Eye Distance:   Left Eye Distance:   Bilateral Distance:    Right Eye Near:   Left Eye Near:    Bilateral Near:     Physical Exam Vitals reviewed.  Constitutional:      General: She is not in acute distress.    Appearance: She is not toxic-appearing.  HENT:     Right Ear: Tympanic membrane and ear canal normal.     Left Ear: Tympanic membrane and ear canal normal.     Nose: Nose normal.     Mouth/Throat:     Mouth: Mucous membranes are moist.     Pharynx: No oropharyngeal exudate or posterior oropharyngeal erythema.  Eyes:     Extraocular Movements: Extraocular movements intact.     Conjunctiva/sclera: Conjunctivae normal.     Pupils: Pupils are equal, round, and reactive to light.  Cardiovascular:     Rate and Rhythm: Normal rate and regular rhythm.     Heart sounds: No murmur heard. Pulmonary:     Effort: No respiratory distress.     Breath sounds: No stridor. No wheezing, rhonchi or rales.  Chest:     Chest wall: No tenderness.  Musculoskeletal:     Cervical back: Neck supple.  Lymphadenopathy:     Cervical: No cervical adenopathy.  Skin:    Capillary Refill: Capillary refill takes less than 2 seconds.     Coloration: Skin is not jaundiced or pale.     Comments: I cannot see any rash under the left  breast, including I cannot see any vesicular lesions nor can I see any confluent erythema.  Dysesthesia when I touch that area on the skin  Neurological:     General: No  focal deficit present.     Mental Status: She is alert and oriented to person, place, and time.  Psychiatric:        Behavior: Behavior normal.      UC Treatments / Results  Labs (all labs ordered are listed, but only abnormal results are displayed) Labs Reviewed - No data to display  EKG   Radiology DG Chest 2 View  Result Date: 01/10/2023 CLINICAL DATA:  cough, shortness of breath EXAM: CHEST - 2 VIEW COMPARISON:  12/26/2018 FINDINGS: The heart size and mediastinal contours are within normal limits. Aortic atherosclerosis. No focal airspace consolidation, pleural effusion, or pneumothorax. The visualized skeletal structures are unremarkable. IMPRESSION: No active cardiopulmonary disease. Electronically Signed   By: Davina Poke D.O.   On: 01/10/2023 09:32    Procedures Procedures (including critical care time)  Medications Ordered in UC Medications - No data to display  Initial Impression / Assessment and Plan / UC Course  I have reviewed the triage vital signs and the nursing notes.  Pertinent labs & imaging results that were available during my care of the patient were reviewed by me and considered in my medical decision making (see chart for details).        Chest x-ray is clear  Tessalon Perles are sent in for the cough, and I am going to go ahead and send in Valtrex for possible impending shingles Final Clinical Impressions(s) / UC Diagnoses   Final diagnoses:  Viral URI with cough     Discharge Instructions      Chest x-ray is clear and does not show pneumonia or fluid  Take benzonatate 100 mg, 1 tab every 8 hours as needed for cough.  Take valacyclovir 1000 mg--1 tablet 3 times daily for 7 days; this is for possible early shingles.  Please follow-up with your primary care.  If the burning pain in your left lower chest continues, it could be that she need treatment for chronic nerve pain from the history of shingles.      ED Prescriptions      Medication Sig Dispense Auth. Provider   capsicum (ZOSTRIX) 0.075 % topical cream Apply 1 Application topically 2 (two) times daily as needed. 28.3 g Barrett Henle, MD   benzonatate (TESSALON) 100 MG capsule Take 1 capsule (100 mg total) by mouth 3 (three) times daily as needed for cough. 21 capsule Barrett Henle, MD   valACYclovir (VALTREX) 1000 MG tablet Take 1 tablet (1,000 mg total) by mouth 3 (three) times daily for 7 days. 21 tablet Rashaad Hallstrom, Gwenlyn Perking, MD      I have reviewed the PDMP during this encounter.   Barrett Henle, MD 01/10/23 [phone]

## 2023-01-28 ENCOUNTER — Emergency Department (HOSPITAL_COMMUNITY)
Admission: EM | Admit: 2023-01-28 | Discharge: 2023-01-28 | Disposition: A | Discharge status: Home / Self Care | Payer: 59 | Attending: Emergency Medicine | Admitting: Emergency Medicine

## 2023-01-28 ENCOUNTER — Encounter (HOSPITAL_COMMUNITY): Payer: Self-pay | Admitting: Emergency Medicine

## 2023-01-28 ENCOUNTER — Other Ambulatory Visit: Payer: Self-pay

## 2023-01-28 ENCOUNTER — Emergency Department (HOSPITAL_COMMUNITY): Payer: 59

## 2023-01-28 DIAGNOSIS — Z7982 Long term (current) use of aspirin: Secondary | ICD-10-CM | POA: Insufficient documentation

## 2023-01-28 DIAGNOSIS — R0602 Shortness of breath: Secondary | ICD-10-CM | POA: Diagnosis not present

## 2023-01-28 DIAGNOSIS — E119 Type 2 diabetes mellitus without complications: Secondary | ICD-10-CM | POA: Insufficient documentation

## 2023-01-28 DIAGNOSIS — I10 Essential (primary) hypertension: Secondary | ICD-10-CM | POA: Diagnosis not present

## 2023-01-28 DIAGNOSIS — Z79899 Other long term (current) drug therapy: Secondary | ICD-10-CM | POA: Diagnosis not present

## 2023-01-28 DIAGNOSIS — R0789 Other chest pain: Secondary | ICD-10-CM

## 2023-01-28 LAB — CBC
HCT: 43.9 % (ref 36.0–46.0)
Hemoglobin: 15 g/dL (ref 12.0–15.0)
MCH: 32.4 pg (ref 26.0–34.0)
MCHC: 34.2 g/dL (ref 30.0–36.0)
MCV: 94.8 fL (ref 80.0–100.0)
Platelets: 290 10*3/uL (ref 150–400)
RBC: 4.63 MIL/uL (ref 3.87–5.11)
RDW: 13.7 % (ref 11.5–15.5)
WBC: 7.1 10*3/uL (ref 4.0–10.5)
nRBC: 0 % (ref 0.0–0.2)

## 2023-01-28 LAB — BASIC METABOLIC PANEL
Anion gap: 12 (ref 5–15)
BUN: 14 mg/dL (ref 8–23)
CO2: 25 mmol/L (ref 22–32)
Calcium: 10.1 mg/dL (ref 8.9–10.3)
Chloride: 100 mmol/L (ref 98–111)
Creatinine, Ser: 1.15 mg/dL — ABNORMAL HIGH (ref 0.44–1.00)
GFR, Estimated: 50 mL/min — ABNORMAL LOW (ref >60)
Glucose, Bld: 247 mg/dL — ABNORMAL HIGH (ref 70–99)
Potassium: 3.4 mmol/L — ABNORMAL LOW (ref 3.5–5.1)
Sodium: 137 mmol/L (ref 135–145)

## 2023-01-28 LAB — TROPONIN I (HIGH SENSITIVITY): Troponin I (High Sensitivity): 8 ng/L (ref <18)

## 2023-01-28 MED ORDER — ACETAMINOPHEN 325 MG PO TABS
650.0000 mg | ORAL_TABLET | Freq: Once | ORAL | Status: AC
  Administered 2023-01-28: 650 mg via ORAL
  Filled 2023-01-28: qty 2

## 2023-01-28 MED ORDER — IBUPROFEN 400 MG PO TABS
400.0000 mg | ORAL_TABLET | Freq: Once | ORAL | Status: AC
  Administered 2023-01-28: 400 mg via ORAL
  Filled 2023-01-28: qty 1

## 2023-01-28 NOTE — Discharge Instructions (Signed)
You were seen in the emergency department for your chest pain.  Your workup showed no signs of heart attack, pneumonia or abnormality within your heart or lungs.  It is likely that you pulled a muscle in your chest wall and you can take Tylenol and Motrin as needed for pain and both can be taken up to every 6 hours.  You should follow-up with your primary doctor in the next few days to have your symptoms rechecked.  You should return to the emergency department for significantly worsening pain, severe shortness of breath, you pass out or if you have any other new or concerning symptoms.

## 2023-01-28 NOTE — ED Provider Triage Note (Signed)
Emergency Medicine Provider Triage Evaluation Note  Leah Thomas [facility] , a 72 y.o. female  was evaluated in triage.  Pt complains of sharp pain on left side of chest x 2 weeks, short of breath since last night.  Went to urgent care 2 weeks ago for fine rash to left lower chest, told she had shingles.  Completed medications, area remains sensitive  Review of Systems  Positive:  Negative:   Physical Exam  BP (!) 131/92   Pulse (!) 109   Temp 98.6 F (37 C)   Resp 18   Ht '5\' 3"'$  (1.6 m)   Wt 77.1 kg   SpO2 99%   BMI 30.11 kg/m  Gen:   Awake, no distress   Resp:  Normal effort  MSK:   Moves extremities without difficulty  Other:  Lungs CTA, HR RRR  Medical Decision Making  Medically screening exam initiated at 2:43 PM.  Appropriate orders placed.  Totianna Poll Castagna was informed that the remainder of the evaluation will be completed by another provider, this initial triage assessment does not replace that evaluation, and the importance of remaining in the ED until their evaluation is complete.     Tacy Learn, PA-C 01/28/23 1444

## 2023-01-28 NOTE — ED Triage Notes (Signed)
Pt here with cough, shingles like rash diagnosed two weeks ago, finished all medications. Pt states is still having pain under left breast and swelling, pt reports mild SOb. Denies recent fevers, cough. Pt reports has cut back smoking. Pt reports sharp pain around her breast.

## 2023-01-28 NOTE — ED Provider Notes (Signed)
[facility] Provider Note   CSN: [id number] Arrival date & time: 01/28/23  1350     History {Add pertinent medical, surgical, social history, OB history to HPI:1} Chief Complaint  Patient presents with   Chest Pain    Leah Thomas is a 72 y.o. female.  Patient is a 72 year old female with a past medical history of hypertension and diabetes presenting to the emergency department with left-sided chest pain.  She states that her pain has been ongoing for 3 weeks and comes and goes.  She states that it is worse when she is laying on her left side.  She states that she initially had a rash and was treated for shingles.  She states the rash resolved but she is continue to have pain.  She states that it feels like a pulling type of pain.  She states that she was doing heavy lifting prior to starting to have the pain.  She states that she has had mild intermittent shortness of breath.  She denies any fevers or cough.  She denies any numbness or weakness.  The history is provided by the patient and the spouse.  Chest Pain      Home Medications Prior to Admission medications   Medication Sig Start Date End Date Taking? Authorizing Provider  acetaminophen (TYLENOL) 325 MG tablet Take 2 tablets (650 mg total) by mouth every 6 (six) hours as needed. 12/26/18   Khatri, Hina, PA-C  amLODipine (NORVASC) 5 MG tablet Take 5 mg by mouth daily.    [provider]  aspirin 81 MG tablet Take 81 mg by mouth daily.    [provider]  atorvastatin (LIPITOR) 20 MG tablet Take 20 mg by mouth daily.    [provider]  benzonatate (TESSALON) 100 MG capsule Take 1 capsule (100 mg total) by mouth 3 (three) times daily as needed for cough. 01/10/23   Barrett Henle, MD  Calcium Citrate (CITRACAL PO) Take 2 tablets by mouth daily.    [provider]  capsicum (ZOSTRIX) 0.075 % topical cream Apply 1 Application topically 2  (two) times daily as needed. 01/10/23   Barrett Henle, MD  Cyanocobalamin (B-12) 500 MCG TABS Take by mouth.    [provider]  hydrochlorothiazide (HYDRODIURIL) 25 MG tablet Take 25 mg by mouth daily.    [provider]  lisinopril (PRINIVIL,ZESTRIL) 20 MG tablet Take 40 mg by mouth 2 (two) times daily.    [provider]  losartan (COZAAR) 100 MG tablet Take 100 mg by mouth daily.    [provider]  metFORMIN (GLUMETZA) 500 MG (MOD) 24 hr tablet Take 1,000 mg by mouth 2 (two) times daily with a meal.    [provider]  saxagliptin HCl (ONGLYZA) 5 MG TABS tablet Take 5 mg by mouth daily.    [provider]  tiZANidine (ZANAFLEX) 4 MG tablet Take 1 tablet (4 mg total) by mouth every 8 (eight) hours as needed for muscle spasms. 11/14/22   Melynda Ripple, MD  traMADol (ULTRAM) 50 MG tablet Take 50 mg by mouth every 6 (six) hours as needed. For pain    [provider]      Allergies    Benadryl [diphenhydramine hcl] and Dilantin [phenytoin]    Review of Systems   Review of Systems  Cardiovascular:  Positive for chest pain.    Physical Exam Updated Vital Signs BP (!) 142/90   Pulse 74  Temp 98.3 F (36.8 C) (Oral)   Resp 13   Ht '5\' 3"'$  (1.6 m)   Wt 77.1 kg   SpO2 100%   BMI 30.11 kg/m  Physical Exam Constitutional:      General: She is not in acute distress.    Appearance: She is well-developed.  HENT:     Head: Normocephalic and atraumatic.  Eyes:     Extraocular Movements: Extraocular movements intact.  Cardiovascular:     Rate and Rhythm: Regular rhythm.     Pulses:          Radial pulses are 2+ on the right side and 2+ on the left side.     Heart sounds: Normal heart sounds.  Pulmonary:     Effort: Pulmonary effort is normal.     Breath sounds: Normal breath sounds.  Chest:     Chest wall: Tenderness (L-sided chest wall) present.  Abdominal:     Palpations: Abdomen is soft.     Tenderness:  There is no abdominal tenderness.  Musculoskeletal:     Cervical back: Normal range of motion and neck supple.     Right lower leg: No edema.     Left lower leg: No edema.  Skin:    General: Skin is warm and dry.     Findings: No rash.  Neurological:     General: No focal deficit present.     Mental Status: She is alert and oriented to person, place, and time.     ED Results / Procedures / Treatments   Labs (all labs ordered are listed, but only abnormal results are displayed) Labs Reviewed  BASIC METABOLIC PANEL - Abnormal; Notable for the following components:      Result Value   Potassium 3.4 (*)    Glucose, Bld 247 (*)    Creatinine, Ser 1.15 (*)    GFR, Estimated 50 (*)    All other components within normal limits  CBC  TROPONIN I (HIGH SENSITIVITY)    EKG EKG Interpretation  Date/Time:  Monday January 28 2023 14:35:10 EDT Ventricular Rate:  112 PR Interval:  136 QRS Duration: 74 QT Interval:  320 QTC Calculation: 436 R Axis:   92 Text Interpretation: Sinus tachycardia Rightward axis Low voltage QRS Cannot rule out Anterior infarct , age undetermined Abnormal ECG No significant change since last tracing Interpretation limited secondary to artifact Confirmed by Leanord Asal ([id number]) on 01/28/2023 6:50:02 PM  Radiology DG Chest 2 View  Result Date: 01/28/2023 CLINICAL DATA:  Chest pain cough EXAM: CHEST - 2 VIEW COMPARISON:  None Available. FINDINGS: The heart size and mediastinal contours are within normal limits. Atherosclerotic calcification of the aortic arch both lungs are clear. The visualized skeletal structures are unremarkable. IMPRESSION: No active cardiopulmonary disease. Electronically Signed   By: Keane Police D.O.   On: 01/28/2023 15:22    Procedures Procedures  {Document cardiac monitor, telemetry assessment procedure when appropriate:1}  Medications Ordered in ED Medications  acetaminophen (TYLENOL) tablet 650 mg (650 mg Oral Given 01/28/23 1921)   ibuprofen (ADVIL) tablet 400 mg (400 mg Oral Given 01/28/23 1921)    ED Course/ Medical Decision Making/ A&P   {   Click here for ABCD2, HEART and other calculatorsREFRESH Note before signing :1}                          Medical Decision Making Amount and/or Complexity of Data Reviewed Labs: ordered. Radiology: ordered.  Risk  OTC drugs. Prescription drug management.   ***  {Document critical care time when appropriate:1} {Document review of labs and clinical decision tools ie heart score, Chads2Vasc2 etc:1}  {Document your independent review of radiology images, and any outside records:1} {Document your discussion with family members, caretakers, and with consultants:1} {Document social determinants of health affecting pt's care:1} {Document your decision making why or why not admission, treatments were needed:1} Final Clinical Impression(s) / ED Diagnoses Final diagnoses:  None    Rx / DC Orders ED Discharge Orders     None

## 2023-02-18 ENCOUNTER — Ambulatory Visit
Admission: EM | Admit: 2023-02-18 | Discharge: 2023-02-18 | Disposition: A | Discharge status: Home / Self Care | Payer: 59 | Attending: Internal Medicine | Admitting: Internal Medicine

## 2023-02-18 DIAGNOSIS — B029 Zoster without complications: Secondary | ICD-10-CM | POA: Diagnosis not present

## 2023-02-18 MED ORDER — VALACYCLOVIR HCL 1 G PO TABS: 1000.0000 mg | ORAL_TABLET | Freq: Three times a day (TID) | ORAL | 0 refills | Status: AC

## 2023-02-18 NOTE — ED Triage Notes (Signed)
Pt c/o concern for shingles. Rash onset ~ thurs. Located to right upper chest. States this happened in [place] as well.

## 2023-02-18 NOTE — ED Provider Notes (Addendum)
[facility]    CSN: [id number] Arrival date & time: 02/18/23  0801      History   Chief Complaint Chief Complaint  Patient presents with   Rash    HPI Leah Thomas is a 72 y.o. female.   Patient presents with itchy and painful rash present to right upper chest that started about 5 days ago.  Patient reports that she has had shingles before and this appears and feels similar.  She denies any associated changes in environment including lotions, soaps, detergents, foods, etc.  Denies any associated fever.  Patient reports that she has had shingles multiple times before with last case being in February. She states that she has had shingles vaccine.    Rash   Past Medical History:  Diagnosis Date   Bursitis    Diabetes mellitus without complication    Hypertension    Paget disease of bone     Patient Active Problem List   Diagnosis Date Noted   Paget disease of bone    Bursitis     Past Surgical History:  Procedure Laterality Date   ABDOMINAL HYSTERECTOMY     CHOLECYSTECTOMY      OB History   No obstetric history on file.      Home Medications    Prior to Admission medications   Medication Sig Start Date End Date Taking? Authorizing Provider  valACYclovir (VALTREX) 1000 MG tablet Take 1 tablet (1,000 mg total) by mouth 3 (three) times daily for 7 days. 02/18/23 02/25/23 Yes Avalee Castrellon, Michele Rockers, FNP  acetaminophen (TYLENOL) 325 MG tablet Take 2 tablets (650 mg total) by mouth every 6 (six) hours as needed. 12/26/18   Khatri, Hina, PA-C  amLODipine (NORVASC) 5 MG tablet Take 5 mg by mouth daily.    [provider]  aspirin 81 MG tablet Take 81 mg by mouth daily.    [provider]  atorvastatin (LIPITOR) 20 MG tablet Take 20 mg by mouth daily.    [provider]  benzonatate (TESSALON) 100 MG capsule Take 1 capsule (100 mg total) by mouth 3 (three) times daily as needed for cough. 01/10/23   Barrett Henle, MD  Calcium Citrate  (CITRACAL PO) Take 2 tablets by mouth daily.    [provider]  capsicum (ZOSTRIX) 0.075 % topical cream Apply 1 Application topically 2 (two) times daily as needed. 01/10/23   Barrett Henle, MD  Cyanocobalamin (B-12) 500 MCG TABS Take by mouth.    [provider]  hydrochlorothiazide (HYDRODIURIL) 25 MG tablet Take 25 mg by mouth daily.    [provider]  lisinopril (PRINIVIL,ZESTRIL) 20 MG tablet Take 40 mg by mouth 2 (two) times daily.    [provider]  losartan (COZAAR) 100 MG tablet Take 100 mg by mouth daily.    [provider]  metFORMIN (GLUMETZA) 500 MG (MOD) 24 hr tablet Take 1,000 mg by mouth 2 (two) times daily with a meal.    [provider]  saxagliptin HCl (ONGLYZA) 5 MG TABS tablet Take 5 mg by mouth daily.    [provider]  tiZANidine (ZANAFLEX) 4 MG tablet Take 1 tablet (4 mg total) by mouth every 8 (eight) hours as needed for muscle spasms. 11/14/22   Melynda Ripple, MD  traMADol (ULTRAM) 50 MG tablet Take 50 mg by mouth every 6 (six) hours as needed. For pain    [provider]    Family History Family History  Family  history unknown: Yes    Social History Social History   Tobacco Use   Smoking status: Every Day    Packs/day: 1.00    Years: 10.00    Additional pack years: 0.00    Total pack years: 10.00    Types: Cigarettes   Smokeless tobacco: Never  Substance Use Topics   Alcohol use: No   Drug use: No     Allergies   Benadryl [diphenhydramine hcl] and Dilantin [phenytoin]   Review of Systems Review of Systems Per HPI  Physical Exam Triage Vital Signs ED Triage Vitals [02/18/23 0819]  Enc Vitals Group     BP 139/88     Pulse Rate 89     Resp 16     Temp 98 F (36.7 C)     Temp Source Oral     SpO2 97 %     Weight      Height      Head Circumference      Peak Flow      Pain Score 7     Pain Loc      Pain Edu?      Excl. in [place]?    No data  found.  Updated Vital Signs BP 139/88 (BP Location: Left Arm)   Pulse 89   Temp 98 F (36.7 C) (Oral)   Resp 16   SpO2 97%   Visual Acuity Right Eye Distance:   Left Eye Distance:   Bilateral Distance:    Right Eye Near:   Left Eye Near:    Bilateral Near:     Physical Exam Constitutional:      General: She is not in acute distress.    Appearance: Normal appearance. She is not toxic-appearing or diaphoretic.  HENT:     Head: Normocephalic and atraumatic.  Eyes:     Extraocular Movements: Extraocular movements intact.     Conjunctiva/sclera: Conjunctivae normal.  Pulmonary:     Effort: Pulmonary effort is normal.  Chest:    Skin:    Comments: Patient has a cluster of vesicular lesions that is approximately 0.5 to 0.75 inches in diameter present to right upper chest.  No drainage noted.  Neurological:     General: No focal deficit present.     Mental Status: She is alert and oriented to person, place, and time. Mental status is at baseline.  Psychiatric:        Mood and Affect: Mood normal.        Behavior: Behavior normal.        Thought Content: Thought content normal.        Judgment: Judgment normal.      UC Treatments / Results  Labs (all labs ordered are listed, but only abnormal results are displayed) Labs Reviewed - No data to display  EKG   Radiology No results found.  Procedures Procedures (including critical care time)  Medications Ordered in UC Medications - No data to display  Initial Impression / Assessment and Plan / UC Course  I have reviewed the triage vital signs and the nursing notes.  Pertinent labs & imaging results that were available during my care of the patient were reviewed by me and considered in my medical decision making (see chart for details).     Rash is consistent with herpes zoster.  Will treat with Valtrex.  Latest kidney function and liver function appears normal so no dosage adjustment necessary.  Patient no  longer takes Zanaflex so that should  be safe.  No concern for any systemic complications or complications related to herpes zoster at this time.  Advised patient to follow with PCP given patient is having recurrent shingles.  Patient verbalized understanding and was agreeable with plan. Final Clinical Impressions(s) / UC Diagnoses   Final diagnoses:  Herpes zoster without complication     Discharge Instructions      You have shingles which is being treated with valtrex. Follow up with PCP for further evaluation and management.     ED Prescriptions     Medication Sig Dispense Auth. Provider   valACYclovir (VALTREX) 1000 MG tablet Take 1 tablet (1,000 mg total) by mouth 3 (three) times daily for 7 days. 21 tablet [place], Michele Rockers, [place]      PDMP not reviewed this encounter.   Teodora Medici, [place] 02/18/23 [place], [place], [place] 02/18/23 [phone]

## 2023-02-18 NOTE — Discharge Instructions (Addendum)
You have shingles which is being treated with valtrex. Follow up with PCP for further evaluation and management.

## 2023-07-05 DIAGNOSIS — Z83511 Family history of glaucoma: Secondary | ICD-10-CM | POA: Insufficient documentation

## 2023-07-05 DIAGNOSIS — H02883 Meibomian gland dysfunction of right eye, unspecified eyelid: Secondary | ICD-10-CM | POA: Insufficient documentation

## 2023-07-30 ENCOUNTER — Other Ambulatory Visit: Payer: Self-pay | Admitting: Nurse Practitioner

## 2023-07-30 DIAGNOSIS — Z1231 Encounter for screening mammogram for malignant neoplasm of breast: Secondary | ICD-10-CM

## 2023-08-31 ENCOUNTER — Encounter: Payer: Self-pay | Admitting: *Deleted

## 2023-08-31 ENCOUNTER — Ambulatory Visit
Admission: EM | Admit: 2023-08-31 | Discharge: 2023-08-31 | Disposition: A | Discharge status: Home / Self Care | Payer: 59 | Attending: Internal Medicine | Admitting: Internal Medicine

## 2023-08-31 ENCOUNTER — Other Ambulatory Visit: Payer: Self-pay

## 2023-08-31 DIAGNOSIS — I10 Essential (primary) hypertension: Secondary | ICD-10-CM | POA: Insufficient documentation

## 2023-08-31 DIAGNOSIS — M889 Osteitis deformans of unspecified bone: Secondary | ICD-10-CM | POA: Insufficient documentation

## 2023-08-31 DIAGNOSIS — E669 Obesity, unspecified: Secondary | ICD-10-CM | POA: Insufficient documentation

## 2023-08-31 DIAGNOSIS — J40 Bronchitis, not specified as acute or chronic: Secondary | ICD-10-CM

## 2023-08-31 DIAGNOSIS — M5430 Sciatica, unspecified side: Secondary | ICD-10-CM | POA: Insufficient documentation

## 2023-08-31 DIAGNOSIS — K645 Perianal venous thrombosis: Secondary | ICD-10-CM | POA: Insufficient documentation

## 2023-08-31 DIAGNOSIS — E119 Type 2 diabetes mellitus without complications: Secondary | ICD-10-CM | POA: Insufficient documentation

## 2023-08-31 DIAGNOSIS — J329 Chronic sinusitis, unspecified: Secondary | ICD-10-CM

## 2023-08-31 MED ORDER — AMOXICILLIN-POT CLAVULANATE 875-125 MG PO TABS: 1.0000 | ORAL_TABLET | Freq: Two times a day (BID) | ORAL | 0 refills | Status: DC

## 2023-08-31 MED ORDER — PREDNISONE 20 MG PO TABS
40.0000 mg | ORAL_TABLET | Freq: Every day | ORAL | 0 refills | Status: AC
Start: 2023-08-31 — End: 2023-09-05

## 2023-08-31 NOTE — ED Triage Notes (Signed)
Pt reports cough and fever 1 week ago. States she is still coughing up phlegm. Taking coricidin without relief

## 2023-08-31 NOTE — ED Provider Notes (Signed)
[facility]    CSN: [id number] Arrival date & time: 08/31/23  0272      History   Chief Complaint Chief Complaint  Patient presents with   Cough    HPI Leah Thomas is a 72 y.o. female.   Patient here today for evaluation of cough, congestion this been ongoing for the last week.  She has had some fevers but none over the last 4 days.  She has not had any vomiting or diarrhea.  She has taken over-the-counter medication without resolution.  The history is provided by the patient.    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      Home Medications    Prior to Admission medications   Medication Sig Start Date End Date Taking? Authorizing Provider  acetaminophen (TYLENOL) 650 MG CR tablet Take 1,300 mg by mouth 2 (two) times daily. 10/28/19  Yes [provider]  Alcohol Swabs (ALCOHOL PREP) 70 % PADS  05/21/17  Yes [provider]  amLODipine (NORVASC) 5 MG tablet  Take 5 mg by mouth 2 (two) times daily. 05/04/22  Yes [provider]  amoxicillin-clavulanate (AUGMENTIN) 875-125 MG tablet Take 1 tablet by mouth every 12 (twelve) hours. 08/31/23  Yes Tomi Bamberger, PA-C  aspirin EC 81 MG tablet Take 81 mg by mouth daily. 12/01/15  Yes [provider]  Blood Glucose Calibration (ACCU-CHEK AVIVA) SOLN  03/07/17  Yes [provider]  Blood Glucose Calibration (ACCU-CHEK AVIVA) SOLN  03/07/17  Yes [provider]  Blood Glucose Monitoring Suppl (GLUCOCOM BLOOD GLUCOSE MONITOR) DEVI Check blood glucose twice daily 11/13/16  Yes [provider]  Blood Glucose Monitoring Suppl (TGT BLOOD GLUCOSE MONITORING) w/Device KIT Check blood glucose twice daily 11/13/16  Yes [provider]  Calcium Citrate-Vitamin D 250-5 MG-MCG TABS Take by mouth. 02/20/16  Yes [provider]  cyclobenzaprine (FLEXERIL) 5 MG tablet Take by mouth. 08/08/22  Yes [provider]  Dapagliflozin Pro-metFORMIN ER (XIGDUO XR) 2.03-999 MG TB24 Take by mouth. 05/04/22  Yes [provider]  diclofenac (VOLTAREN) 75 MG EC tablet Take 75 mg by mouth 2 (two) times daily. 05/04/22  Yes [provider]  gabapentin (NEURONTIN) 100 MG capsule Take by mouth. 11/08/22  Yes [provider]  glucose blood (ACCU-CHEK AVIVA PLUS) test strip USE 1 STRIP TO CHECK GLUCOSE TWICE DAILY 04/19/21  Yes [provider]  glucose blood (ACCU-CHEK AVIVA  PLUS) test strip 1 each by Other route as needed. 05/14/23  Yes [provider]  hydrochlorothiazide (HYDRODIURIL) 25 MG tablet Take 1 tablet by mouth daily. 05/04/22  Yes [provider]  Hydrocortisone, Perianal, 1 % CREA Apply topically. 06/23/20  Yes [provider]  Lancets (ONETOUCH DELICA PLUS LANCET30G) MISC 2 (two) times daily. 12/05/22  Yes [provider]  latanoprost (XALATAN) 0.005 % ophthalmic solution Place 1 drop into both eyes at  bedtime. 10/26/22  Yes [provider]  linagliptin (TRADJENTA) 5 MG TABS tablet Take 1 tablet by mouth daily. 02/08/23  Yes [provider]  losartan (COZAAR) 100 MG tablet Take 1 tablet by mouth daily. 05/04/22  Yes [provider]  meloxicam (MOBIC) 7.5 MG tablet Take 7.5 mg by mouth daily. 07/17/23  Yes [provider]  Multiple Vitamins-Minerals (ONE-A-DAY VITACRAVES SOUR) CHEW Chew by mouth. 05/29/18  Yes [provider]  omeprazole (PRILOSEC) 40 MG capsule Take 1 capsule by mouth daily. 05/04/22  Yes [provider]  OneTouch Delica Lancets 33G MISC USE 1  TO CHECK GLUCOSE TWICE DAILY 04/24/21  Yes [provider]  pravastatin (PRAVACHOL) 20 MG tablet Take 1 tablet by mouth at bedtime. 05/04/22  Yes [provider]  predniSONE (DELTASONE) 20 MG tablet Take 2 tablets (40 mg total) by mouth daily with breakfast for 5 days. 08/31/23 09/05/23 Yes Tomi Bamberger, PA-C  triamcinolone ointment (KENALOG) 0.1 % Apply 1 Application topically 2 (two) times daily. 10/03/21  Yes [provider]  acetaminophen (TYLENOL) 325 MG tablet Take 2 tablets (650 mg total) by mouth every 6 (six) hours as needed. 12/26/18   Khatri, Hina, PA-C  amLODipine (NORVASC) 5 MG tablet Take 5 mg by mouth daily.    [provider]  aspirin 81 MG tablet Take 81 mg by mouth daily.    [provider]  atorvastatin (LIPITOR) 20 MG tablet Take 20 mg by mouth daily.    [provider]  benzonatate (TESSALON) 100 MG capsule Take 1 capsule (100 mg total) by mouth 3 (three) times daily as needed for cough. 01/10/23   Zenia Resides, MD  Calcium Citrate (CITRACAL PO) Take 2 tablets by mouth daily.    [provider]  capsicum (ZOSTRIX) 0.075 % topical cream Apply 1 Application topically 2 (two) times daily as needed. 01/10/23   Zenia Resides, MD  Cyanocobalamin (B-12) 500 MCG TABS Take by mouth.    [provider]   hydrochlorothiazide (HYDRODIURIL) 25 MG tablet Take 25 mg by mouth daily.    [provider]  lisinopril (PRINIVIL,ZESTRIL) 20 MG tablet Take 40 mg by mouth 2 (two) times daily.    [provider]  losartan (COZAAR) 100 MG tablet Take 100 mg by mouth daily.    [provider]  metFORMIN (GLUMETZA) 500 MG (MOD) 24 hr tablet Take 1,000 mg by mouth 2 (two) times daily with a meal.    [provider]  Multiple Vitamins-Minerals (WOMENS MULTIVITAMIN PO) Take by mouth.    [provider]  saxagliptin HCl (ONGLYZA) 5 MG TABS tablet Take 5 mg by mouth daily.    [provider]  tiZANidine (ZANAFLEX) 4 MG tablet Take 1 tablet (4 mg total) by mouth every 8 (eight) hours as needed for muscle spasms. 11/14/22   Domenick Gong, MD  traMADol (ULTRAM) 50 MG tablet Take 50 mg by mouth every 6 (six) hours as needed. For pain    [provider]  Family History Family History  Family history unknown: Yes    Social History Social History   Tobacco Use   Smoking status: Former    Current packs/day: 1.00    Average packs/day: 1 pack/day for 10.0 years (10.0 ttl pk-yrs)    Types: Cigarettes   Smokeless tobacco: Never  Vaping Use   Vaping status: Never Used  Substance Use Topics   Alcohol use: No   Drug use: No     Allergies   Benadryl [diphenhydramine hcl], Dilantin [phenytoin], Diphenhydramine hcl, and Phenytoin sodium extended   Review of Systems Review of Systems  Constitutional:  Positive for fever (resolved). Negative for chills.  HENT:  Positive for congestion and sinus pressure. Negative for ear pain and sore throat.   Eyes:  Negative for discharge and redness.  Respiratory:  Positive for cough and wheezing. Negative for shortness of breath.   Gastrointestinal:  Negative for abdominal pain, diarrhea, nausea and vomiting.     Physical Exam Triage Vital Signs ED Triage Vitals  Encounter Vitals Group     BP       Systolic BP Percentile      Diastolic BP Percentile      Pulse      Resp      Temp      Temp src      SpO2      Weight      Height      Head Circumference      Peak Flow      Pain Score      Pain Loc      Pain Education      Exclude from Growth Chart    No data found.  Updated Vital Signs BP 118/75 (BP Location: Left Arm)   Pulse 84   Temp 98.3 F (36.8 C) (Oral)   Resp 18   SpO2 98%   Physical Exam Vitals and nursing note reviewed.  Constitutional:      General: She is not in acute distress.    Appearance: Normal appearance. She is not ill-appearing.  HENT:     Head: Normocephalic and atraumatic.     Nose: Congestion present.     Mouth/Throat:     Mouth: Mucous membranes are moist.     Pharynx: No oropharyngeal exudate or posterior oropharyngeal erythema.  Eyes:     Conjunctiva/sclera: Conjunctivae normal.  Cardiovascular:     Rate and Rhythm: Normal rate and regular rhythm.     Heart sounds: Normal heart sounds. No murmur heard. Pulmonary:     Effort: Pulmonary effort is normal. No respiratory distress.     Breath sounds: Wheezing present. No rhonchi or rales.     Comments: Scattered rare wheezes Skin:    General: Skin is warm and dry.  Neurological:     Mental Status: She is alert.  Psychiatric:        Mood and Affect: Mood normal.        Thought Content: Thought content normal.      UC Treatments / Results  Labs (all labs ordered are listed, but only abnormal results are displayed) Labs Reviewed - No data to display  EKG   Radiology No results found.  Procedures Procedures (including critical care time)  Medications Ordered in UC Medications - No data to display  Initial Impression / Assessment and Plan / UC Course  I have reviewed the triage vital signs and the nursing notes.  Pertinent labs & imaging results that were  available during my care of the patient were reviewed by me and considered in my medical decision making (see chart  for details).    Augmentin and steroid burst prescribed to cover sinobronchitis.  Recommended follow-up if no gradual improvement with any further concerns.  Final Clinical Impressions(s) / UC Diagnoses   Final diagnoses:  Sinobronchitis   Discharge Instructions   None    ED Prescriptions     Medication Sig Dispense Auth. Provider   predniSONE (DELTASONE) 20 MG tablet Take 2 tablets (40 mg total) by mouth daily with breakfast for 5 days. 10 tablet Tomi Bamberger, PA-C   amoxicillin-clavulanate (AUGMENTIN) 875-125 MG tablet Take 1 tablet by mouth every 12 (twelve) hours. 14 tablet Tomi Bamberger, PA-C      PDMP not reviewed this encounter.   Tomi Bamberger, PA-C 08/31/23 [phone]

## 2023-10-01 ENCOUNTER — Ambulatory Visit: Payer: 59

## 2023-10-01 ENCOUNTER — Encounter: Payer: Self-pay | Admitting: Student

## 2023-10-01 ENCOUNTER — Other Ambulatory Visit: Payer: Self-pay | Admitting: Student

## 2023-10-01 ENCOUNTER — Ambulatory Visit
Admission: RE | Admit: 2023-10-01 | Discharge: 2023-10-01 | Disposition: A | Discharge status: Home / Self Care | Payer: 59 | Source: Ambulatory Visit | Attending: Student

## 2023-10-01 DIAGNOSIS — G8929 Other chronic pain: Secondary | ICD-10-CM

## 2023-10-14 ENCOUNTER — Ambulatory Visit
Admission: RE | Admit: 2023-10-14 | Discharge: 2023-10-14 | Disposition: A | Discharge status: Home / Self Care | Payer: 59 | Source: Ambulatory Visit | Attending: Nurse Practitioner | Admitting: Nurse Practitioner

## 2023-10-14 DIAGNOSIS — Z1231 Encounter for screening mammogram for malignant neoplasm of breast: Secondary | ICD-10-CM

## 2024-04-26 ENCOUNTER — Ambulatory Visit
Admission: EM | Admit: 2024-04-26 | Discharge: 2024-04-26 | Disposition: A | Discharge status: Home / Self Care | Attending: Physician Assistant | Admitting: Physician Assistant

## 2024-04-26 ENCOUNTER — Other Ambulatory Visit: Payer: Self-pay

## 2024-04-26 ENCOUNTER — Encounter: Payer: Self-pay | Admitting: *Deleted

## 2024-04-26 DIAGNOSIS — N760 Acute vaginitis: Secondary | ICD-10-CM | POA: Diagnosis present

## 2024-04-26 MED ORDER — FLUCONAZOLE 150 MG PO TABS
ORAL_TABLET | ORAL | 0 refills | Status: AC
Start: 2024-04-26 — End: ?

## 2024-04-26 NOTE — ED Triage Notes (Signed)
 PT reports vaginal itching on rt side. Pt has tried OTC cream 2 times with out relief.

## 2024-04-26 NOTE — ED Provider Notes (Signed)
 [facility]    CSN: [id number] Arrival date & time: 04/26/24  0808      History   Chief Complaint Chief Complaint  Patient presents with   Vaginal Itching    HPI Leah Thomas is a 73 y.o. female.   Patient here today for evaluation of vaginal itching, primarily to the right side.  She denies any rash.  She has not had any discharge.  She reports she has tried Monistat 7 and Monistat 3 without resolution.  She reports some improvement with itching with the Monistat cream that comes along with treatment.  She denies any concerns for STDs but is okay with screening.    The history is provided by the patient.    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      Home Medications    Prior to Admission medications   Medication Sig Start Date End Date Taking? Authorizing Provider  fluconazole  (DIFLUCAN ) 150 MG tablet Take one tab PO then repeat dose in 3 days if symptoms persist 04/26/24   Yes Vernestine Gondola, PA-C  acetaminophen  (TYLENOL ) 325 MG tablet Take 2 tablets (650 mg total) by mouth every 6 (six) hours as needed. 12/26/18  Yes Khatri, Hina, PA-C  acetaminophen  (TYLENOL ) 650 MG CR tablet Take 1,300 mg by mouth 2 (two) times daily. 10/28/19  Yes [provider]  Alcohol Swabs (ALCOHOL PREP) 70 % PADS  05/21/17  Yes [provider]  amLODipine (NORVASC) 5 MG tablet Take 5 mg by mouth daily.    [provider]  amLODipine (NORVASC) 5 MG tablet Take 5 mg by mouth 2 (two) times daily. 05/04/22  Yes [provider]  aspirin 81 MG tablet Take 81 mg by mouth daily.    [provider]  aspirin EC 81 MG tablet Take 81 mg by mouth daily. 12/01/15  Yes [provider]  atorvastatin (LIPITOR) 20 MG tablet Take 20 mg by mouth daily.   Yes [provider]  Blood Glucose Calibration (ACCU-CHEK AVIVA) SOLN  03/07/17  Yes [provider]  Blood Glucose Calibration (ACCU-CHEK AVIVA) SOLN  03/07/17  Yes [provider]  Blood Glucose Monitoring Suppl (GLUCOCOM BLOOD GLUCOSE MONITOR) DEVI Check blood glucose twice daily 11/13/16  Yes [provider]  Blood Glucose Monitoring Suppl (TGT BLOOD GLUCOSE MONITORING) w/Device KIT Check blood glucose twice daily 11/13/16  Yes [provider]  Calcium Citrate (CITRACAL PO) Take  2 tablets by mouth daily.   Yes [provider]  Calcium Citrate-Vitamin D 250-5 MG-MCG TABS Take by mouth. 02/20/16  Yes [provider]  Cyanocobalamin (B-12) 500 MCG TABS Take by mouth.   Yes [provider]  cyclobenzaprine (FLEXERIL) 5 MG tablet Take by mouth. 08/08/22  Yes [provider]  Dapagliflozin Pro-metFORMIN ER (XIGDUO XR) 2.03-999 MG TB24 Take by mouth. 05/04/22   [provider]  diclofenac (VOLTAREN) 75 MG EC tablet Take 75 mg by mouth 2 (two) times daily. 05/04/22   [provider]  glucose blood (ACCU-CHEK AVIVA PLUS) test  strip USE 1 STRIP TO CHECK GLUCOSE TWICE DAILY 04/19/21  Yes [provider]  glucose blood (ACCU-CHEK AVIVA PLUS) test strip 1 each by Other route as needed. 05/14/23  Yes [provider]  hydrochlorothiazide (HYDRODIURIL) 25 MG tablet Take 25 mg by mouth daily.   Yes [provider]  hydrochlorothiazide (HYDRODIURIL) 25 MG tablet Take 1 tablet by mouth daily. 05/04/22   [provider]  Lancets (ONETOUCH DELICA PLUS LANCET30G) MISC 2 (two) times daily. 12/05/22  Yes [provider]  latanoprost (XALATAN) 0.005 % ophthalmic solution Place 1 drop into both eyes at bedtime. 10/26/22  Yes [provider]  linagliptin (TRADJENTA) 5 MG TABS tablet Take 1 tablet by mouth daily. 02/08/23  Yes [provider]  losartan (COZAAR) 100 MG tablet Take 100 mg by mouth daily.    [provider]  losartan (COZAAR) 100 MG tablet Take 1 tablet by mouth daily. 05/04/22  Yes [provider]  metFORMIN (GLUMETZA) 500 MG (MOD) 24 hr tablet Take 1,000 mg by mouth 2 (two) times daily with a meal.   Yes [provider]  Multiple Vitamins-Minerals (ONE-A-DAY VITACRAVES SOUR) CHEW Chew by mouth. 05/29/18  Yes [provider]  Multiple Vitamins-Minerals (WOMENS MULTIVITAMIN PO) Take by mouth.   Yes [provider]  omeprazole (PRILOSEC) 40 MG capsule Take 1 capsule by mouth daily. 05/04/22  Yes [provider]  OneTouch Delica Lancets 33G MISC USE 1  TO CHECK GLUCOSE TWICE DAILY 04/24/21  Yes [provider]  pravastatin (PRAVACHOL) 20 MG tablet Take 1 tablet by mouth at bedtime. 05/04/22  Yes [provider]    Family History Family History  Problem Relation Age of Onset   BRCA 1/2 Neg Hx    Breast cancer Neg Hx     Social History Social History   Tobacco Use   Smoking status: Former    Current packs/day: 1.00    Average packs/day: 1 pack/day for 10.0 years (10.0 ttl pk-yrs)    Types:  Cigarettes   Smokeless tobacco: Never  Vaping Use   Vaping status: Never Used  Substance Use Topics   Alcohol use: No   Drug use: No     Allergies   Benadryl [diphenhydramine hcl], Dilantin [phenytoin], Diphenhydramine hcl, and Phenytoin sodium extended   Review of Systems Review of Systems  Constitutional:  Negative for chills and fever.  Eyes:  Negative for discharge and redness.  Respiratory:  Negative for shortness of breath.   Gastrointestinal:  Negative for abdominal pain, nausea and vomiting.  Genitourinary:  Negative for genital sores and vaginal discharge.     Physical Exam Triage Vital Signs ED Triage Vitals  Encounter Vitals Group     BP      Systolic BP Percentile      Diastolic BP Percentile      Pulse  Resp      Temp      Temp src      SpO2      Weight      Height      Head Circumference      Peak Flow      Pain Score      Pain Loc      Pain Education      Exclude from Growth Chart    No data found.  Updated Vital Signs BP 130/85   Pulse 91   Temp 98.3 F (36.8 C)   Resp 20   SpO2 98%   Visual Acuity Right Eye Distance:   Left Eye Distance:   Bilateral Distance:    Right Eye Near:   Left Eye Near:    Bilateral Near:     Physical Exam Vitals and nursing note reviewed.  Constitutional:      General: She is not in acute distress.    Appearance: Normal appearance. She is not ill-appearing.  HENT:     Head: Normocephalic and atraumatic.  Eyes:     Conjunctiva/sclera: Conjunctivae normal.  Cardiovascular:     Rate and Rhythm: Normal rate.  Pulmonary:     Effort: Pulmonary effort is normal. No respiratory distress.  Neurological:     Mental Status: She is alert.  Psychiatric:        Mood and Affect: Mood normal.        Behavior: Behavior normal.        Thought Content: Thought content normal.      UC Treatments / Results  Labs (all labs ordered are listed, but only abnormal results are displayed) Labs Reviewed   CERVICOVAGINAL ANCILLARY ONLY    EKG   Radiology No results found.  Procedures Procedures (including critical care time)  Medications Ordered in UC Medications - No data to display  Initial Impression / Assessment and Plan / UC Course  I have reviewed the triage vital signs and the nursing notes.  Pertinent labs & imaging results that were available during my care of the patient were reviewed by me and considered in my medical decision making (see chart for details).    Screening ordered for STDs as well as yeast vaginitis and BV.  Will treat with Diflucan  while awaiting results.  Recommended follow-up with any further concerns.  Briefly discussed possibility of vaginal atrophy as cause of symptoms as well.  Will await results for further recommendation.  Final Clinical Impressions(s) / UC Diagnoses   Final diagnoses:  Acute vaginitis   Discharge Instructions   None    ED Prescriptions     Medication Sig Dispense Auth. Provider   fluconazole  (DIFLUCAN ) 150 MG tablet Take one tab PO then repeat dose in 3 days if symptoms persist 2 tablet Vernestine Gondola, PA-C      PDMP not reviewed this encounter.   Vernestine Gondola, PA-C 04/26/24 1321

## 2024-04-27 LAB — CERVICOVAGINAL ANCILLARY ONLY
Bacterial Vaginitis (gardnerella): NEGATIVE
Candida Glabrata: NEGATIVE
Candida Vaginitis: NEGATIVE
Chlamydia: NEGATIVE
Comment: NEGATIVE
Comment: NEGATIVE
Comment: NEGATIVE
Comment: NEGATIVE
Comment: NEGATIVE
Comment: NORMAL
Neisseria Gonorrhea: NEGATIVE
Trichomonas: NEGATIVE

## 2024-05-07 ENCOUNTER — Encounter (HOSPITAL_COMMUNITY): Payer: Self-pay

## 2024-05-07 ENCOUNTER — Emergency Department (HOSPITAL_COMMUNITY)
Admission: EM | Admit: 2024-05-07 | Discharge: 2024-05-07 | Disposition: A | Discharge status: Home / Self Care | Attending: Emergency Medicine | Admitting: Emergency Medicine

## 2024-05-07 ENCOUNTER — Other Ambulatory Visit: Payer: Self-pay

## 2024-05-07 DIAGNOSIS — E119 Type 2 diabetes mellitus without complications: Secondary | ICD-10-CM | POA: Diagnosis not present

## 2024-05-07 DIAGNOSIS — L292 Pruritus vulvae: Secondary | ICD-10-CM | POA: Insufficient documentation

## 2024-05-07 DIAGNOSIS — Z7982 Long term (current) use of aspirin: Secondary | ICD-10-CM | POA: Insufficient documentation

## 2024-05-07 DIAGNOSIS — Z7984 Long term (current) use of oral hypoglycemic drugs: Secondary | ICD-10-CM | POA: Insufficient documentation

## 2024-05-07 DIAGNOSIS — N898 Other specified noninflammatory disorders of vagina: Secondary | ICD-10-CM

## 2024-05-07 LAB — URINALYSIS, W/ REFLEX TO CULTURE (INFECTION SUSPECTED)
Bacteria, UA: NONE SEEN
Bilirubin Urine: NEGATIVE
Glucose, UA: >=500 mg/dL — AB
Hgb urine dipstick: NEGATIVE
Ketones, ur: NEGATIVE mg/dL
Leukocytes,Ua: NEGATIVE
Nitrite: NEGATIVE
Protein, ur: NEGATIVE mg/dL
Specific Gravity, Urine: 1.022 (ref 1.005–1.030)
pH: 5 (ref 5.0–8.0)

## 2024-05-07 LAB — WET PREP, GENITAL
Clue Cells Wet Prep HPF POC: NONE SEEN
Sperm: NONE SEEN
Trich, Wet Prep: NONE SEEN
WBC, Wet Prep HPF POC: <10 (ref <10)
Yeast Wet Prep HPF POC: NONE SEEN

## 2024-05-07 NOTE — ED Provider Notes (Signed)
 [facility] Provider Note   CSN: [id number] Arrival date & time: 05/07/24  6578     Patient presents with: Vaginal Itching   Leah Thomas is a 73 y.o. female history of diabetes, hysterectomy here presenting with vaginal itchiness.  Patient noticed discharge and itchiness for the last month or so.  Patient has been to primary care doctor as well as urgent care.  Patient was prescribed Diflucan  recently with no relief.  Patient denies any dysuria.  Patient noticed some brownish discharge.  Patient has not seen her GYN doctor yet.   The history is provided by the patient.       Prior to Admission medications   Medication Sig Start Date End Date Taking? Authorizing Provider  acetaminophen  (TYLENOL ) 325 MG tablet Take 2 tablets (650 mg total) by mouth every 6 (six) hours as needed. 12/26/18   Khatri, Hina, PA-C  acetaminophen  (TYLENOL ) 650 MG CR tablet Take 1,300 mg by mouth 2 (two) times daily. 10/28/19   [provider]  Alcohol Swabs (ALCOHOL PREP) 70 % PADS  05/21/17   [provider]  amLODipine (NORVASC) 5 MG tablet Take 5 mg by mouth daily.    [provider]  amLODipine (NORVASC) 5 MG tablet Take 5 mg by mouth 2 (two) times daily. 05/04/22   [provider]  aspirin 81 MG tablet Take 81 mg by mouth daily.    [provider]  aspirin EC 81 MG tablet Take 81 mg by mouth daily. 12/01/15   [provider]  atorvastatin (LIPITOR) 20 MG tablet Take 20 mg by mouth daily.    [provider]  Blood Glucose Calibration (ACCU-CHEK AVIVA) SOLN  03/07/17   [provider]  Blood Glucose Calibration (ACCU-CHEK AVIVA) SOLN  03/07/17   [provider]  Blood Glucose Monitoring Suppl (GLUCOCOM BLOOD GLUCOSE MONITOR) DEVI Check blood glucose twice daily 11/13/16   [provider]  Blood Glucose Monitoring Suppl (TGT BLOOD GLUCOSE MONITORING) w/Device KIT Check blood  glucose twice daily 11/13/16   [provider]  Calcium Citrate (CITRACAL PO) Take 2 tablets by mouth daily.    [provider]  Calcium Citrate-Vitamin D 250-5 MG-MCG TABS Take by mouth. 02/20/16   [provider]  Cyanocobalamin (B-12) 500 MCG TABS Take by mouth.    [provider]  cyclobenzaprine (FLEXERIL) 5 MG tablet Take by mouth. 08/08/22   [provider]  Dapagliflozin Pro-metFORMIN ER (XIGDUO XR) 2.03-999 MG TB24 Take by mouth. 05/04/22   [provider]  diclofenac (VOLTAREN) 75 MG EC tablet Take 75 mg by mouth 2 (two) times daily. 05/04/22   [provider]  fluconazole  (DIFLUCAN ) 150 MG tablet Take one tab PO then repeat dose in 3 days if symptoms persist 04/26/24   Vernestine Gondola, PA-C  glucose blood (ACCU-CHEK AVIVA PLUS) test strip USE 1 STRIP TO CHECK GLUCOSE TWICE DAILY 04/19/21   [provider]  glucose blood (ACCU-CHEK AVIVA PLUS) test strip 1 each by Other route as needed. 05/14/23   [provider]  hydrochlorothiazide (HYDRODIURIL) 25 MG tablet Take 25 mg by mouth daily.    [provider]  hydrochlorothiazide (HYDRODIURIL) 25 MG tablet Take 1 tablet by mouth daily. 05/04/22   [provider]  Lancets (ONETOUCH DELICA PLUS LANCET30G) MISC 2 (two) times daily. 12/05/22   [provider]  latanoprost (XALATAN) 0.005 % ophthalmic solution Place 1 drop into both eyes at bedtime.  10/26/22   [provider]  linagliptin (TRADJENTA) 5 MG TABS tablet Take 1 tablet by mouth daily. 02/08/23   [provider]  losartan (COZAAR) 100 MG tablet Take 100 mg by mouth daily.    [provider]  losartan (COZAAR) 100 MG tablet Take 1 tablet by mouth daily. 05/04/22   [provider]  metFORMIN (GLUMETZA) 500 MG (MOD) 24 hr tablet Take 1,000 mg by mouth 2 (two) times daily with a meal.    [provider]  Multiple Vitamins-Minerals (ONE-A-DAY VITACRAVES  SOUR) CHEW Chew by mouth. 05/29/18   [provider]  Multiple Vitamins-Minerals (WOMENS MULTIVITAMIN PO) Take by mouth.    [provider]  omeprazole (PRILOSEC) 40 MG capsule Take 1 capsule by mouth daily. 05/04/22   [provider]  OneTouch Delica Lancets 33G MISC USE 1  TO CHECK GLUCOSE TWICE DAILY 04/24/21   [provider]  pravastatin (PRAVACHOL) 20 MG tablet Take 1 tablet by mouth at bedtime. 05/04/22   [provider]    Allergies: Benadryl [diphenhydramine hcl], Dilantin [phenytoin], Diphenhydramine hcl, and Phenytoin sodium extended    Review of Systems  Genitourinary:  Positive for vaginal discharge.  All other systems reviewed and are negative.   Updated Vital Signs BP (!) 141/86   Pulse 84   Temp 97.7 F (36.5 C) (Oral)   Resp 18   Ht 5' 3 (1.6 m)   Wt 82.6 kg   SpO2 99%   BMI 32.24 kg/m   Physical Exam Vitals and nursing note reviewed.  Constitutional:      Comments: Chronically ill but well-appearing  HENT:     Head: Normocephalic.     Nose: Nose normal.     Mouth/Throat:     Mouth: Mucous membranes are moist.   Eyes:     Pupils: Pupils are equal, round, and reactive to light.    Cardiovascular:     Rate and Rhythm: Normal rate.     Pulses: Normal pulses.  Pulmonary:     Effort: Pulmonary effort is normal.  Abdominal:     General: Abdomen is flat.  Genitourinary:    Comments: Pelvic exam performed there is some whitish discharge.  No CMT or adnexal tenderness.  No obvious vesicular lesions  Musculoskeletal:     Cervical back: Normal range of motion.   Skin:    General: Skin is warm.     Capillary Refill: Capillary refill takes less than 2 seconds.   Neurological:     General: No focal deficit present.     Mental Status: She is alert and oriented to person, place, and time.   Psychiatric:        Mood and Affect: Mood normal.        Behavior: Behavior normal.     (all labs ordered are listed,  but only abnormal results are displayed) Labs Reviewed  URINALYSIS, W/ REFLEX TO CULTURE (INFECTION SUSPECTED) - Abnormal; Notable for the following components:      Result Value   Glucose, UA >=500 (*)    All other components within normal limits  WET PREP, GENITAL  GC/CHLAMYDIA PROBE AMP ([facility]) NOT AT [facility]    EKG: None  Radiology: No results found.   Procedures   Medications Ordered in the ED - No data to display  Medical Decision Making Leah Thomas is a 73 y.o. female here presenting with vaginal itchiness.  Wet prep is unremarkable.  Patient had negative GC chlamydia recently.  Urinalysis is unremarkable.  I told her that at this point she needs to follow-up with GYN doctor for further assessment.   Problems Addressed: Vaginal itching: chronic illness or injury  Amount and/or Complexity of Data Reviewed Labs: ordered.     Final diagnoses:  None    ED Discharge Orders     None          Dalene Duck, MD 05/07/24 [phone]

## 2024-05-07 NOTE — Discharge Instructions (Addendum)
 As we discussed your urinalysis and your swabs are normal today  I have referred you to women's health please call their office for follow-up  Return to ER if you have severe vaginal pain or discharge or fever

## 2024-05-07 NOTE — ED Triage Notes (Addendum)
 Pt. Stated Ive had vaginal itching irritation . Ive gone to UC and my DR and given medication pill and cream and not help . This started May 22 and continues. Ive had all test and all negative.Its really so disturbing to me.

## 2024-05-07 NOTE — ED Notes (Signed)
 New wet prep sample collected and sent down

## 2024-05-07 NOTE — ED Notes (Signed)
 Per lab, Wet prep sample was not tested or sent down in time. This RN will recollect.

## 2024-05-08 LAB — GC/CHLAMYDIA PROBE AMP (~~LOC~~) NOT AT ARMC
Chlamydia: NEGATIVE
Comment: NEGATIVE
Comment: NORMAL
Neisseria Gonorrhea: NEGATIVE

## 2024-09-10 ENCOUNTER — Other Ambulatory Visit: Payer: Self-pay | Admitting: Student

## 2024-09-10 DIAGNOSIS — Z1231 Encounter for screening mammogram for malignant neoplasm of breast: Secondary | ICD-10-CM

## 2024-10-14 ENCOUNTER — Ambulatory Visit
Admission: RE | Admit: 2024-10-14 | Discharge: 2024-10-14 | Disposition: A | Source: Ambulatory Visit | Attending: Student

## 2024-10-14 DIAGNOSIS — Z1231 Encounter for screening mammogram for malignant neoplasm of breast: Secondary | ICD-10-CM

## 2024-10-21 ENCOUNTER — Other Ambulatory Visit: Payer: Self-pay | Admitting: Student

## 2024-10-21 DIAGNOSIS — R928 Other abnormal and inconclusive findings on diagnostic imaging of breast: Secondary | ICD-10-CM

## 2024-10-30 ENCOUNTER — Other Ambulatory Visit

## 2024-11-05 ENCOUNTER — Inpatient Hospital Stay: Admission: RE | Admit: 2024-11-05

## 2024-11-05 DIAGNOSIS — R928 Other abnormal and inconclusive findings on diagnostic imaging of breast: Secondary | ICD-10-CM
